# Patient Record
Sex: Female | Born: 1940 | Race: White | Hispanic: No | Smoking: Never smoker
Health system: Southern US, Community
[De-identification: ages and names within clinical notes are randomized; demographics above are authoritative.]

## PROBLEM LIST (undated history)

## (undated) DIAGNOSIS — E039 Hypothyroidism, unspecified: Secondary | ICD-10-CM

## (undated) DIAGNOSIS — H5440 Blindness, one eye, unspecified eye: Secondary | ICD-10-CM

## (undated) DIAGNOSIS — I10 Essential (primary) hypertension: Secondary | ICD-10-CM

## (undated) DIAGNOSIS — M1990 Unspecified osteoarthritis, unspecified site: Secondary | ICD-10-CM

## (undated) DIAGNOSIS — E785 Hyperlipidemia, unspecified: Secondary | ICD-10-CM

## (undated) DIAGNOSIS — I739 Peripheral vascular disease, unspecified: Secondary | ICD-10-CM

## (undated) DIAGNOSIS — I442 Atrioventricular block, complete: Secondary | ICD-10-CM

## (undated) DIAGNOSIS — E119 Type 2 diabetes mellitus without complications: Secondary | ICD-10-CM

## (undated) DIAGNOSIS — I2510 Atherosclerotic heart disease of native coronary artery without angina pectoris: Secondary | ICD-10-CM

## (undated) DIAGNOSIS — Z950 Presence of cardiac pacemaker: Secondary | ICD-10-CM

## (undated) HISTORY — DX: Atrioventricular block, complete: I44.2

## (undated) HISTORY — DX: Presence of cardiac pacemaker: Z95.0

## (undated) HISTORY — PX: EYE SURGERY: SHX253

## (undated) HISTORY — DX: Atherosclerotic heart disease of native coronary artery without angina pectoris: I25.10

---

## 2017-05-06 ENCOUNTER — Ambulatory Visit: Payer: Self-pay | Admitting: Podiatry

## 2019-09-24 ENCOUNTER — Ambulatory Visit: Payer: Medicare Other | Attending: Internal Medicine

## 2019-09-24 DIAGNOSIS — Z20822 Contact with and (suspected) exposure to covid-19: Secondary | ICD-10-CM

## 2019-09-28 ENCOUNTER — Telehealth: Payer: Self-pay | Admitting: *Deleted

## 2019-09-28 NOTE — Telephone Encounter (Signed)
Patient called for results ,still pending,

## 2019-09-30 LAB — NOVEL CORONAVIRUS, NAA

## 2021-12-31 ENCOUNTER — Emergency Department (HOSPITAL_COMMUNITY): Payer: Medicare HMO

## 2021-12-31 ENCOUNTER — Observation Stay (HOSPITAL_COMMUNITY)
Admission: EM | Admit: 2021-12-31 | Discharge: 2022-01-03 | Disposition: A | Discharge status: Home / Self Care | Payer: Medicare HMO | Attending: Internal Medicine | Admitting: Internal Medicine

## 2021-12-31 ENCOUNTER — Other Ambulatory Visit: Payer: Self-pay

## 2021-12-31 DIAGNOSIS — I2510 Atherosclerotic heart disease of native coronary artery without angina pectoris: Principal | ICD-10-CM | POA: Insufficient documentation

## 2021-12-31 DIAGNOSIS — E669 Obesity, unspecified: Secondary | ICD-10-CM | POA: Insufficient documentation

## 2021-12-31 DIAGNOSIS — Z79899 Other long term (current) drug therapy: Secondary | ICD-10-CM | POA: Insufficient documentation

## 2021-12-31 DIAGNOSIS — F0390 Unspecified dementia without behavioral disturbance: Secondary | ICD-10-CM | POA: Diagnosis not present

## 2021-12-31 DIAGNOSIS — I160 Hypertensive urgency: Secondary | ICD-10-CM | POA: Insufficient documentation

## 2021-12-31 DIAGNOSIS — Z79890 Hormone replacement therapy (postmenopausal): Secondary | ICD-10-CM | POA: Diagnosis not present

## 2021-12-31 DIAGNOSIS — E1151 Type 2 diabetes mellitus with diabetic peripheral angiopathy without gangrene: Secondary | ICD-10-CM | POA: Diagnosis not present

## 2021-12-31 DIAGNOSIS — E039 Hypothyroidism, unspecified: Secondary | ICD-10-CM | POA: Insufficient documentation

## 2021-12-31 DIAGNOSIS — Z7982 Long term (current) use of aspirin: Secondary | ICD-10-CM | POA: Diagnosis not present

## 2021-12-31 DIAGNOSIS — E1165 Type 2 diabetes mellitus with hyperglycemia: Secondary | ICD-10-CM | POA: Diagnosis not present

## 2021-12-31 DIAGNOSIS — D751 Secondary polycythemia: Secondary | ICD-10-CM | POA: Diagnosis not present

## 2021-12-31 DIAGNOSIS — R918 Other nonspecific abnormal finding of lung field: Secondary | ICD-10-CM | POA: Diagnosis not present

## 2021-12-31 DIAGNOSIS — D696 Thrombocytopenia, unspecified: Secondary | ICD-10-CM | POA: Diagnosis present

## 2021-12-31 DIAGNOSIS — I200 Unstable angina: Principal | ICD-10-CM

## 2021-12-31 DIAGNOSIS — Z6832 Body mass index (BMI) 32.0-32.9, adult: Secondary | ICD-10-CM | POA: Insufficient documentation

## 2021-12-31 DIAGNOSIS — R079 Chest pain, unspecified: Secondary | ICD-10-CM | POA: Diagnosis present

## 2021-12-31 DIAGNOSIS — I700 Atherosclerosis of aorta: Secondary | ICD-10-CM | POA: Insufficient documentation

## 2021-12-31 DIAGNOSIS — E1169 Type 2 diabetes mellitus with other specified complication: Secondary | ICD-10-CM | POA: Diagnosis not present

## 2021-12-31 DIAGNOSIS — Z7984 Long term (current) use of oral hypoglycemic drugs: Secondary | ICD-10-CM | POA: Diagnosis not present

## 2021-12-31 DIAGNOSIS — R2689 Other abnormalities of gait and mobility: Secondary | ICD-10-CM | POA: Insufficient documentation

## 2021-12-31 DIAGNOSIS — Z66 Do not resuscitate: Secondary | ICD-10-CM | POA: Insufficient documentation

## 2021-12-31 DIAGNOSIS — I739 Peripheral vascular disease, unspecified: Secondary | ICD-10-CM | POA: Diagnosis present

## 2021-12-31 DIAGNOSIS — E785 Hyperlipidemia, unspecified: Secondary | ICD-10-CM | POA: Insufficient documentation

## 2021-12-31 HISTORY — DX: Type 2 diabetes mellitus without complications: E11.9

## 2021-12-31 HISTORY — DX: Blindness, one eye, unspecified eye: H54.40

## 2021-12-31 HISTORY — DX: Unspecified osteoarthritis, unspecified site: M19.90

## 2021-12-31 HISTORY — DX: Hyperlipidemia, unspecified: E78.5

## 2021-12-31 HISTORY — DX: Hypothyroidism, unspecified: E03.9

## 2021-12-31 HISTORY — DX: Peripheral vascular disease, unspecified: I73.9

## 2021-12-31 HISTORY — DX: Essential (primary) hypertension: I10

## 2021-12-31 LAB — CBC
HCT: 47.7 % — ABNORMAL HIGH (ref 36.0–46.0)
Hemoglobin: 16.5 g/dL — ABNORMAL HIGH (ref 12.0–15.0)
MCH: 32.2 pg (ref 26.0–34.0)
MCHC: 34.6 g/dL (ref 30.0–36.0)
MCV: 93 fL (ref 80.0–100.0)
Platelets: 143 10*3/uL — ABNORMAL LOW (ref 150–400)
RBC: 5.13 MIL/uL — ABNORMAL HIGH (ref 3.87–5.11)
RDW: 13.5 % (ref 11.5–15.5)
WBC: 9.7 10*3/uL (ref 4.0–10.5)
nRBC: 0 % (ref 0.0–0.2)

## 2021-12-31 LAB — BASIC METABOLIC PANEL
Anion gap: 10 (ref 5–15)
BUN: 8 mg/dL (ref 8–23)
CO2: 27 mmol/L (ref 22–32)
Calcium: 10.1 mg/dL (ref 8.9–10.3)
Chloride: 101 mmol/L (ref 98–111)
Creatinine, Ser: 0.66 mg/dL (ref 0.44–1.00)
GFR, Estimated: >60 mL/min (ref >60)
Glucose, Bld: 125 mg/dL — ABNORMAL HIGH (ref 70–99)
Potassium: 4 mmol/L (ref 3.5–5.1)
Sodium: 138 mmol/L (ref 135–145)

## 2021-12-31 LAB — TROPONIN I (HIGH SENSITIVITY)
Troponin I (High Sensitivity): 10 ng/L (ref <18)
Troponin I (High Sensitivity): 16 ng/L (ref <18)

## 2021-12-31 LAB — CBG MONITORING, ED: Glucose-Capillary: 125 mg/dL — ABNORMAL HIGH (ref 70–99)

## 2021-12-31 MED ORDER — IOHEXOL 350 MG/ML SOLN
100.0000 mL | Freq: Once | INTRAVENOUS | Status: AC | PRN
Start: 2021-12-31 — End: 2021-12-31
  Administered 2021-12-31: 100 mL via INTRAVENOUS

## 2021-12-31 MED ORDER — METHOCARBAMOL 1000 MG/10ML IJ SOLN
500.0000 mg | Freq: Once | INTRAMUSCULAR | Status: AC
  Administered 2021-12-31: 500 mg via INTRAMUSCULAR
  Filled 2021-12-31: qty 5

## 2021-12-31 MED ORDER — INSULIN ASPART 100 UNIT/ML IJ SOLN: 0.0000 [IU] | Freq: Three times a day (TID) | INTRAMUSCULAR | Status: DC

## 2021-12-31 MED ORDER — DONEPEZIL HCL 5 MG PO TABS
5.0000 mg | ORAL_TABLET | Freq: Every day | ORAL | Status: DC
  Administered 2022-01-01 – 2022-01-02 (×3): 5 mg via ORAL
  Filled 2021-12-31 (×3): qty 1

## 2021-12-31 MED ORDER — ASPIRIN 81 MG PO CHEW
324.0000 mg | CHEWABLE_TABLET | Freq: Once | ORAL | Status: DC
  Filled 2021-12-31: qty 4

## 2021-12-31 MED ORDER — MORPHINE SULFATE (PF) 2 MG/ML IV SOLN
1.0000 mg | INTRAVENOUS | Status: DC | PRN
  Administered 2022-01-01: 1 mg via INTRAVENOUS
  Filled 2021-12-31: qty 1

## 2021-12-31 MED ORDER — ACETAMINOPHEN 325 MG PO TABS
650.0000 mg | ORAL_TABLET | Freq: Four times a day (QID) | ORAL | Status: DC | PRN
  Administered 2022-01-01 – 2022-01-03 (×4): 650 mg via ORAL
  Filled 2021-12-31 (×5): qty 2

## 2021-12-31 MED ORDER — AMLODIPINE BESYLATE 5 MG PO TABS
5.0000 mg | ORAL_TABLET | Freq: Every day | ORAL | Status: DC
  Administered 2022-01-01 – 2022-01-02 (×3): 5 mg via ORAL
  Filled 2021-12-31 (×3): qty 1

## 2021-12-31 MED ORDER — NITROGLYCERIN 2 % TD OINT
1.0000 [in_us] | TOPICAL_OINTMENT | Freq: Once | TRANSDERMAL | Status: AC
  Administered 2021-12-31: 1 [in_us] via TOPICAL
  Filled 2021-12-31: qty 1

## 2021-12-31 MED ORDER — ATENOLOL 25 MG PO TABS
75.0000 mg | ORAL_TABLET | Freq: Every day | ORAL | Status: DC
  Administered 2022-01-01 – 2022-01-02 (×3): 75 mg via ORAL
  Filled 2021-12-31 (×3): qty 3

## 2021-12-31 MED ORDER — ENOXAPARIN SODIUM 40 MG/0.4ML IJ SOSY
40.0000 mg | PREFILLED_SYRINGE | Freq: Every day | INTRAMUSCULAR | Status: DC
  Administered 2022-01-01 – 2022-01-03 (×3): 40 mg via SUBCUTANEOUS
  Filled 2021-12-31 (×3): qty 0.4

## 2021-12-31 MED ORDER — FENTANYL CITRATE PF 50 MCG/ML IJ SOSY: 50.0000 ug | PREFILLED_SYRINGE | INTRAMUSCULAR | Status: DC | PRN

## 2021-12-31 MED ORDER — INSULIN ASPART 100 UNIT/ML IJ SOLN: 0.0000 [IU] | Freq: Every day | INTRAMUSCULAR | Status: DC

## 2021-12-31 MED ORDER — ASPIRIN EC 81 MG PO TBEC
81.0000 mg | DELAYED_RELEASE_TABLET | Freq: Every day | ORAL | Status: DC
Start: 2022-01-01 — End: 2022-01-03
  Administered 2022-01-01 – 2022-01-03 (×3): 81 mg via ORAL
  Filled 2021-12-31 (×3): qty 1

## 2021-12-31 MED ORDER — FENTANYL CITRATE PF 50 MCG/ML IJ SOSY
50.0000 ug | PREFILLED_SYRINGE | Freq: Once | INTRAMUSCULAR | Status: AC
  Administered 2021-12-31: 50 ug via INTRAVENOUS
  Filled 2021-12-31: qty 1

## 2021-12-31 MED ORDER — HYDROCHLOROTHIAZIDE 25 MG PO TABS
25.0000 mg | ORAL_TABLET | Freq: Every day | ORAL | Status: DC
  Administered 2022-01-01 – 2022-01-02 (×3): 25 mg via ORAL
  Filled 2021-12-31 (×3): qty 1

## 2021-12-31 NOTE — ED Triage Notes (Signed)
Pt BIB EMS from home for increasing chest pain over the last week, today was the worst she reports a 5/10 pain, dull left side of chest not radiating, shob, headache, nausea, and weakness. EMS got a bp of 180/120 and after 4 of nitro her BP went down to 148/92 ?

## 2021-12-31 NOTE — ED Notes (Signed)
Pt.'s daughter at the bedside does report the pt was clammy when EMS arrived, pt reports relief of chest pain after nitro admin,  ?

## 2021-12-31 NOTE — ED Notes (Signed)
Patient transported to CT 

## 2021-12-31 NOTE — ED Provider Notes (Signed)
?[facility] ?Provider Note ? ? ?CSN: [id number] ?Arrival date & time: 12/31/21  1805 ? ?  ? ?History ? ?Chief Complaint  ?Patient presents with  ? Chest Pain  ? ? ?Jasmine Crawford is a 81 y.o. female. ? ?HPI ? ?HPI: A 81 year old patient with a history of treated diabetes and hypertension presents for evaluation of chest pain. Initial onset of pain was approximately 1-3 hours ago. The patient's chest pain is described as heaviness/pressure/tightness, is not worse with exertion and is relieved by nitroglycerin. The patient's chest pain is middle- or left-sided, is not well-localized, is not sharp and does radiate to the arms/jaw/neck. The patient does not complain of nausea and denies diaphoresis. The patient has no history of stroke, has no history of peripheral artery disease, has not smoked in the past 90 days, has no relevant family history of coronary artery disease (first degree relative at less than age 64), has no history of hypercholesterolemia and does not have an elevated BMI (>=30).  ? ?81 year old patient comes in with chief complaint of chest pain, headache, neck pain. ?She has history of hypertension and diabetes.  No history of coronary artery disease, stroke. ? ?According to the patient, she has been having posterior headache radiating down her neck and going down the right upper extremity over the last 3 days.  The symptoms are fairly constant.  She has had similar symptoms in the past, but the current episode is lasting longer and getting worse.  She describes the pain as dull, moderately severe pain that is worse with movement of her head.  She indicates that her vision appears slightly worse, but there is no other new dizziness, focal numbness, focal weakness. ? ?Additionally, patient is also complaining of chest pain over the left side.  This is new for her and also present over the last 3 days.  Pain is intermittent.  Pain is nonradiating and there is no  specific exertional, pleuritic or musculoskeletal component associated with it.  Patient indicates associated shortness of breath and nausea and per EMS patient was clammy. ? ?Patient resides in [place] and gets her care at [facility].  ? ?No history of heavy tobacco use disorder. ? ?Home Medications ?Prior to Admission medications   ?Medication Sig Start Date End Date Taking? Authorizing Provider  ?albuterol (VENTOLIN HFA) 108 (90 Base) MCG/ACT inhaler Inhale 1-2 puffs into the lungs every 6 (six) hours as needed for wheezing or shortness of breath.   Yes [provider]  ?amLODipine (NORVASC) 5 MG tablet Take 5 mg by mouth at bedtime.   Yes [provider]  ?ammonium lactate (LAC-HYDRIN) 12 % lotion Apply 1 application. topically as needed for dry skin.   Yes [provider]  ?aspirin EC 81 MG tablet Take 81 mg by mouth daily. Swallow whole.   Yes [provider]  ?atenolol (TENORMIN) 25 MG tablet Take 75 mg by mouth at bedtime.   Yes [provider]  ?donepezil (ARICEPT) 5 MG tablet Take 5 mg by mouth at bedtime.   Yes [provider]  ?fluticasone (FLONASE) 50 MCG/ACT nasal spray Place 1 spray into both nostrils 2 (two) times daily as needed for allergies or rhinitis.   Yes [provider]  ?hydrochlorothiazide (HYDRODIURIL) 25 MG tablet Take 25 mg by mouth at bedtime.   Yes [provider]  ?metFORMIN (GLUCOPHAGE-XR) 500 MG 24 hr tablet Take 500 mg by mouth in the morning and at bedtime.   Yes [provider]  ?naproxen (NAPROSYN) 500 MG tablet Take 500 mg by mouth 2 (two) times daily as needed for mild pain.   Yes [provider]  ?UNKNOWN TO PATIENT Take 1 tablet by mouth See admin instructions. Unknown to patient (tablet for anxiety)- Take 1 tablet by mouth in the morning   Yes [provider]  ?   ? ?Allergies    ?Bacitracin, Erythromycin base, Losartan, and Ramipril   ? ?Review of Systems   ?Review of Systems  ?All  other systems reviewed and are negative. ? ?Physical Exam ?Updated Vital Signs ?BP 124/75 (BP Location: Right Arm)   Pulse 62   Temp 98.1 ?F (36.7 ?C) (Oral)   Resp (!) 21   Ht 5\' 3"  (1.6 m)   Wt 84.3 kg   SpO2 99%   BMI 32.93 kg/m?  ?Physical Exam ?Vitals and nursing note reviewed.  ?Constitutional:   ?   Appearance: She is well-developed.  ?HENT:  ?   Head: Atraumatic.  ?Eyes:  ?   Extraocular Movements: Extraocular movements intact.  ?   Pupils: Pupils are equal, round, and reactive to light.  ?   Comments: No nystagmus  ?Cardiovascular:  ?   Rate and Rhythm: Normal rate.  ?   Heart sounds: Normal heart sounds. No murmur heard. ?Pulmonary:  ?   Effort: Pulmonary effort is normal.  ?   Breath sounds: Normal breath sounds.  ?Chest:  ?   Comments: Patient has 2+ and equal bilateral radial pulse and dorsalis pedis. ?Blood pressure checked in each upper extremity, SBP is 187 on the left upper extremity and 190 in the right upper extremity ?Musculoskeletal:  ?   Cervical back: Normal range of motion and neck supple.  ?   Right lower leg: No edema.  ?   Left lower leg: No edema.  ?Skin: ?   General: Skin is warm and dry.  ?Neurological:  ?   Mental Status: She is alert and oriented to person, place, and time.  ? ? ?ED Results / Procedures / Treatments   ?Labs ?(all labs ordered are listed, but only abnormal results are displayed) ?Labs Reviewed  ?CBC - Abnormal; Notable for the following components:  ?    Result Value  ? RBC 5.13 (*)   ? Hemoglobin 16.5 (*)   ? HCT 47.7 (*)   ? Platelets 143 (*)   ? All other components within normal limits  ?BASIC METABOLIC PANEL - Abnormal; Notable for the following components:  ? Glucose, Bld 125 (*)   ? All other components within normal limits  ?HEMOGLOBIN A1C - Abnormal; Notable for the following components:  ? Hgb A1c MFr Bld 7.3 (*)   ? All other components within normal limits  ?CBC - Abnormal; Notable for the following components:  ? WBC 10.6 (*)   ? Hemoglobin 15.8 (*)    ? All other components within normal limits  ?D-DIMER, QUANTITATIVE - Abnormal; Notable for the following components:  ? D-Dimer, Quant 1.40 (*)   ? All other components within normal limits  ?GLUCOSE, CAPILLARY - Abnormal; Notable for the following components:  ? Glucose-Capillary 288 (*)   ? All other components within normal limits  ?GLUCOSE, CAPILLARY - Abnormal; Notable for the following components:  ? Glucose-Capillary 174 (*)   ? All other components within normal limits  ?GLUCOSE, CAPILLARY - Abnormal; Notable for the following components:  ? Glucose-Capillary 126 (*)   ? All other components within normal limits  ?  GLUCOSE, CAPILLARY - Abnormal; Notable for the following components:  ? Glucose-Capillary 149 (*)   ? All other components within normal limits  ?GLUCOSE, CAPILLARY - Abnormal; Notable for the following components:  ? Glucose-Capillary 266 (*)   ? All other components within normal limits  ?GLUCOSE, CAPILLARY - Abnormal; Notable for the following components:  ? Glucose-Capillary 170 (*)   ? All other components within normal limits  ?GLUCOSE, CAPILLARY - Abnormal; Notable for the following components:  ? Glucose-Capillary 136 (*)   ? All other components within normal limits  ?GLUCOSE, CAPILLARY - Abnormal; Notable for the following components:  ? Glucose-Capillary 177 (*)   ? All other components within normal limits  ?GLUCOSE, CAPILLARY - Abnormal; Notable for the following components:  ? Glucose-Capillary 221 (*)   ? All other components within normal limits  ?GLUCOSE, CAPILLARY - Abnormal; Notable for the following components:  ? Glucose-Capillary 186 (*)   ? All other components within normal limits  ?CBG MONITORING, ED - Abnormal; Notable for the following components:  ? Glucose-Capillary 125 (*)   ? All other components within normal limits  ?CBG MONITORING, ED - Abnormal; Notable for the following components:  ? Glucose-Capillary 238 (*)   ? All other components within normal limits   ?LIPID PANEL  ?TSH  ?TROPONIN I (HIGH SENSITIVITY)  ?TROPONIN I (HIGH SENSITIVITY)  ?TROPONIN I (HIGH SENSITIVITY)  ?TROPONIN I (HIGH SENSITIVITY)  ? ? ?EKG ?EKG Interpretation ? ?Date/Time:  Sunday December 31 2021 1

## 2021-12-31 NOTE — ED Provider Notes (Signed)
Care assumed from Villa Calma MD at shift change, please see their note for full detail, but in brief Jasmine Crawford is a 81 y.o. female presents with  posterior headache radiating down to RUE and chest pain. ? ?Plan: Admission for chest pain workup, pending CT angio ? ?Physical Exam  ?BP (!) 176/84   Pulse 65   Temp 98 ?F (36.7 ?C) (Oral)   Resp 19   Ht 5\' 4"  (1.626 m)   Wt 88.5 kg   SpO2 100%   BMI 33.47 kg/m?  ? ?Physical Exam ? ?Procedures  ?Procedures ? ?ED Course / MDM  ? ?Clinical Course as of 12/31/21 2049  ?2050 Dec 31, 2021  ?2048 Patient's metabolic profile, troponins are still pending.  CT angiogram is also pending. ? ?She received IV fentanyl, feels better.  Chest pain has been off and on since my assessment. ? ?I will put nitro paste on her chest.  As needed fentanyl ordered. ? ?Patient's care will be signed out to my colleague, plan is for patient to be admitted after the CT angiogram and basic blood work-up has resulted. ? ?Patient's family made aware of this plan [AN]  ?  ?Clinical Course User Index ?[AN] 2049, MD  ? ?Medical Decision Making ?Amount and/or Complexity of Data Reviewed ?Labs: ordered. ?Radiology: ordered. ? ?Risk ?Prescription drug management. ? ? ?Patient CT angio of head and neck without acute findings or vertebral dissection.  And.  I agree with radiologist interpretation reviewed imaging myself.  I also viewed and interpreted all labs with delta troponin normal.  Patient has heart score of 6. ? ? ?I consulted with cardiology on-call who reviewed the patient's labs, EKG and imaging and will be willing to consult on her case tomorrow morning after she has been admitted. ? ?Spoke with Dr. Derwood Kaplan who agrees to admit patient. ? ? ? ? ? ?  ?Loney Loh, Janell Quiet ?12/31/21 2258 ? ?  ?2259, MD ?01/03/22 0022 ? ?

## 2021-12-31 NOTE — H&P (Signed)
?History and Physical  ? ? ?Jasmine Crawford ZGY:[id number] DOB: April 14, 1941 DOA: 12/31/2021 ? ?PCP: Marguerita Beards, MD ? ?Patient coming from: Home ? ?Chief Complaint: Chest pain ? ?HPI: Jasmine Crawford is a 81 y.o. female with medical history significant of dementia, hypertension, hyperlipidemia, non-insulin-dependent type 2 diabetes, peripheral arterial disease, hypothyroidism, glaucoma presenting to the ED for evaluation of chest pain, posterior headache, and neck pain.  Blood pressure 180/120 with EMS, went down to 148/92 after she was given nitroglycerin x4.  Blood pressure 182/92 on arrival to the ED, remainder of vital signs stable.  Labs showing hemoglobin 16.5.  Platelet count 143k.  Initial high-sensitivity troponin negative, repeat pending.  EKG showing left bundle branch block, no prior tracing for comparison.  Chest x-ray showing no active disease. CTA head and neck without evidence of dissection.  ED physician discussed the case with Dr. Rosita Fire, cardiology will consult in the morning.  Medications administered in the ED include aspirin 324 mg, fentanyl, Robaxin, and nitroglycerin ointment. ? ?Patient reports 1 week history of intermittent episodes of nonexertional, left-sided, 5 out of 10 intensity, pressure-like chest pain associated with dyspnea, nausea, and diaphoresis.  States typically her blood pressure is high during these episodes.  States today before EMS arrived her chest pain was severe and she was clammy.  She attributes her chest pain to possible lump in her left breast for which she is scheduled for mammogram later this month.  Denies history of heart disease.  Daughter states patient had chest pain about 12 years ago and was seen at [facility] at that time and was told that her work-up was normal.  Patient denies history of blood clots.  She also reports intermittent right-sided posterior headache/neck pain for several days and when she checked her blood pressure yesterday during one of these episodes  it was high in the 170s.  States her chest pain and headache/neck pain usually improve after she takes her blood pressure medications.  Reports compliance with her home medications and has not missed any doses. ? ?Review of Systems:  ?Review of Systems  ?All other systems reviewed and are negative. ? ?Past medical history: See HPI. ? ?Surgical history: Oculoplastic surgery, left breast biopsy ? ?Social history: No tobacco, ethanol, or illicit drug use. ? ?Allergies  ?Allergen Reactions  ? Bacitracin Swelling and Other (See Comments)  ?  No swelling of the throat  ? Erythromycin Base Swelling and Other (See Comments)  ?  No swelling of the throat  ? Losartan Other (See Comments)  ?  Reaction not recalled ?  ? Ramipril Other (See Comments)  ?  Reaction not recalled  ? ? ?Family history: Brother 1 (cancer, diabetes, heart disease, hypertension, brother 2 (diabetes, hypertension), brother 3 (diabetes, hypertension), father (heart disease), paternal aunt (breast cancer) ? ?Prior to Admission medications   ?Medication Sig Start Date End Date Taking? Authorizing Provider  ?albuterol (VENTOLIN HFA) 108 (90 Base) MCG/ACT inhaler Inhale 1-2 puffs into the lungs every 6 (six) hours as needed for wheezing or shortness of breath.   Yes [provider]  ?amLODipine (NORVASC) 5 MG tablet Take 5 mg by mouth at bedtime.   Yes [provider]  ?ammonium lactate (LAC-HYDRIN) 12 % lotion Apply 1 application. topically as needed for dry skin.   Yes [provider]  ?aspirin EC 81 MG tablet Take 81 mg by mouth daily. Swallow whole.   Yes [provider]  ?atenolol (TENORMIN) 25 MG tablet Take 75 mg by mouth  at bedtime.   Yes [provider]  ?donepezil (ARICEPT) 5 MG tablet Take 5 mg by mouth at bedtime.   Yes [provider]  ?fluticasone (FLONASE) 50 MCG/ACT nasal spray Place 1 spray into both nostrils 2 (two) times daily as needed for allergies or rhinitis.   Yes [provider]  ?hydrochlorothiazide (HYDRODIURIL) 25 MG tablet Take 25 mg by mouth at bedtime.   Yes [provider]  ?metFORMIN (GLUCOPHAGE-XR) 500 MG 24 hr tablet Take 500 mg by mouth in the morning and at bedtime.   Yes [provider]  ?naproxen (NAPROSYN) 500 MG tablet Take 500 mg by mouth 2 (two) times daily as needed for mild pain.   Yes [provider]  ?UNKNOWN TO PATIENT Take 1 tablet by mouth See admin instructions. Unknown to patient (tablet for anxiety)- Take 1 tablet by mouth in the morning   Yes [provider]  ? ? ?Physical Exam: ?Vitals:  ? 12/31/21 2245 12/31/21 2300 12/31/21 2330 12/31/21 2345  ?BP: 103/71 136/80 134/74 (!) 158/85  ?Pulse: 61 61 62 71  ?Resp: 12 16 18 19   ?Temp:      ?TempSrc:      ?SpO2: 98% 97% 99% 99%  ?Weight:      ?Height:      ? ? ?Physical Exam ?Constitutional:   ?   General: She is not in acute distress. ?HENT:  ?   Head: Normocephalic and atraumatic.  ?Eyes:  ?   Extraocular Movements: Extraocular movements intact.  ?   Conjunctiva/sclera: Conjunctivae normal.  ?Cardiovascular:  ?   Rate and Rhythm: Normal rate and regular rhythm.  ?   Pulses: Normal pulses.  ?   Comments: Chest pain reproducible on exam on palpation of the left lower chest ?Pulmonary:  ?   Effort: Pulmonary effort is normal. No respiratory distress.  ?   Breath sounds: Normal breath sounds. No wheezing or rales.  ?Abdominal:  ?   General: Bowel sounds are normal. There is no distension.  ?   Palpations: Abdomen is soft.  ?   Tenderness: There is no abdominal tenderness.  ?Musculoskeletal:  ?   Cervical back: Normal range of motion. No rigidity.  ?   Comments: Mild edema at the level of ankles bilaterally  ?Skin: ?   General: Skin is warm and dry.  ?Neurological:  ?   General: No focal deficit present.  ?   Mental Status: She is alert and oriented to person, place, and time.  ?   Cranial Nerves: No cranial nerve deficit.  ?   Sensory: No sensory deficit.  ?   Motor:  No weakness.  ?  ? ?Labs on Admission: I have personally reviewed following labs and imaging studies ? ?CBC: ?Recent Labs  ?Lab 12/31/21 ?1851  ?WBC 9.7  ?HGB 16.5*  ?HCT 47.7*  ?MCV 93.0  ?PLT 143*  ? ?Basic Metabolic Panel: ?Recent Labs  ?Lab 12/31/21 ?2033  ?NA 138  ?K 4.0  ?CL 101  ?CO2 27  ?GLUCOSE 125*  ?BUN 8  ?CREATININE 0.66  ?CALCIUM 10.1  ? ?GFR: ?Estimated Creatinine Clearance: 58.4 mL/min (by C-G formula based on SCr of 0.66 mg/dL). ?Liver Function Tests: ?No results for input(s): AST, ALT, ALKPHOS, BILITOT, PROT, ALBUMIN in the last 168 hours. ?No results for input(s): LIPASE, AMYLASE in the last 168 hours. ?No results for input(s): AMMONIA in the last 168 hours. ?Coagulation Profile: ?No results for input(s): INR, PROTIME in the last 168 hours. ?  Cardiac Enzymes: ?No results for input(s): CKTOTAL, CKMB, CKMBINDEX, TROPONINI in the last 168 hours. ?BNP (last 3 results) ?No results for input(s): PROBNP in the last 8760 hours. ?HbA1C: ?No results for input(s): HGBA1C in the last 72 hours. ?CBG: ?Recent Labs  ?Lab 12/31/21 ?2024  ?GLUCAP 125*  ? ?Lipid Profile: ?No results for input(s): CHOL, HDL, LDLCALC, TRIG, CHOLHDL, LDLDIRECT in the last 72 hours. ?Thyroid Function Tests: ?No results for input(s): TSH, T4TOTAL, FREET4, T3FREE, THYROIDAB in the last 72 hours. ?Anemia Panel: ?No results for input(s): VITAMINB12, FOLATE, FERRITIN, TIBC, IRON, RETICCTPCT in the last 72 hours. ?Urine analysis: ?No results found for: COLORURINE, APPEARANCEUR, LABSPEC, PHURINE, GLUCOSEU, HGBUR, BILIRUBINUR, KETONESUR, PROTEINUR, UROBILINOGEN, NITRITE, LEUKOCYTESUR ? ?Radiological Exams on Admission: I have personally reviewed images ?CT ANGIO HEAD NECK W WO CM ? ?Result Date: 12/31/2021 ?CLINICAL DATA:  Posterior headache and neck pain EXAM: CT ANGIOGRAPHY HEAD AND NECK TECHNIQUE: Multidetector CT imaging of the head and neck was performed using the standard protocol during bolus administration of intravenous contrast.  Multiplanar CT image reconstructions and MIPs were obtained to evaluate the vascular anatomy. Carotid stenosis measurements (when applicable) are obtained utilizing NASCET criteria, using the distal internal carotid

## 2022-01-01 ENCOUNTER — Inpatient Hospital Stay (HOSPITAL_COMMUNITY): Payer: Medicare HMO

## 2022-01-01 ENCOUNTER — Encounter (HOSPITAL_COMMUNITY): Payer: Self-pay | Admitting: Internal Medicine

## 2022-01-01 ENCOUNTER — Observation Stay (HOSPITAL_COMMUNITY): Payer: Medicare HMO

## 2022-01-01 ENCOUNTER — Observation Stay (HOSPITAL_BASED_OUTPATIENT_CLINIC_OR_DEPARTMENT_OTHER): Payer: Medicare HMO

## 2022-01-01 DIAGNOSIS — E1169 Type 2 diabetes mellitus with other specified complication: Secondary | ICD-10-CM | POA: Diagnosis present

## 2022-01-01 DIAGNOSIS — F0390 Unspecified dementia without behavioral disturbance: Secondary | ICD-10-CM | POA: Diagnosis not present

## 2022-01-01 DIAGNOSIS — E039 Hypothyroidism, unspecified: Secondary | ICD-10-CM | POA: Diagnosis present

## 2022-01-01 DIAGNOSIS — I208 Other forms of angina pectoris: Secondary | ICD-10-CM | POA: Diagnosis not present

## 2022-01-01 DIAGNOSIS — R931 Abnormal findings on diagnostic imaging of heart and coronary circulation: Secondary | ICD-10-CM

## 2022-01-01 DIAGNOSIS — R9431 Abnormal electrocardiogram [ECG] [EKG]: Secondary | ICD-10-CM | POA: Diagnosis not present

## 2022-01-01 DIAGNOSIS — D696 Thrombocytopenia, unspecified: Secondary | ICD-10-CM

## 2022-01-01 DIAGNOSIS — E669 Obesity, unspecified: Secondary | ICD-10-CM

## 2022-01-01 DIAGNOSIS — E66811 Obesity, class 1: Secondary | ICD-10-CM | POA: Diagnosis present

## 2022-01-01 DIAGNOSIS — I160 Hypertensive urgency: Secondary | ICD-10-CM

## 2022-01-01 DIAGNOSIS — R079 Chest pain, unspecified: Secondary | ICD-10-CM

## 2022-01-01 DIAGNOSIS — I2510 Atherosclerotic heart disease of native coronary artery without angina pectoris: Secondary | ICD-10-CM | POA: Diagnosis not present

## 2022-01-01 DIAGNOSIS — I739 Peripheral vascular disease, unspecified: Secondary | ICD-10-CM | POA: Diagnosis not present

## 2022-01-01 LAB — ECHOCARDIOGRAM COMPLETE
AR max vel: 2.62 cm2
AV Area VTI: 2.58 cm2
AV Area mean vel: 2.42 cm2
AV Mean grad: 6 mmHg
AV Peak grad: 11.8 mmHg
Ao pk vel: 1.72 m/s
Area-P 1/2: 2.95 cm2
Calc EF: 61.7 %
Height: 63 in
S' Lateral: 2.6 cm
Single Plane A2C EF: 60.2 %
Single Plane A4C EF: 61.7 %
Weight: 2974.4 oz

## 2022-01-01 LAB — GLUCOSE, CAPILLARY
Glucose-Capillary: 288 mg/dL — ABNORMAL HIGH (ref 70–99)
Glucose-Capillary: 174 mg/dL — ABNORMAL HIGH (ref 70–99)
Glucose-Capillary: 126 mg/dL — ABNORMAL HIGH (ref 70–99)
Glucose-Capillary: 149 mg/dL — ABNORMAL HIGH (ref 70–99)
Glucose-Capillary: 266 mg/dL — ABNORMAL HIGH (ref 70–99)

## 2022-01-01 LAB — CBC
HCT: 45.8 % (ref 36.0–46.0)
Hemoglobin: 15.8 g/dL — ABNORMAL HIGH (ref 12.0–15.0)
MCH: 31.6 pg (ref 26.0–34.0)
MCHC: 34.5 g/dL (ref 30.0–36.0)
MCV: 91.6 fL (ref 80.0–100.0)
Platelets: 209 10*3/uL (ref 150–400)
RBC: 5 MIL/uL (ref 3.87–5.11)
RDW: 13.5 % (ref 11.5–15.5)
WBC: 10.6 10*3/uL — ABNORMAL HIGH (ref 4.0–10.5)
nRBC: 0 % (ref 0.0–0.2)

## 2022-01-01 LAB — CBG MONITORING, ED: Glucose-Capillary: 238 mg/dL — ABNORMAL HIGH (ref 70–99)

## 2022-01-01 LAB — LIPID PANEL
Cholesterol: 162 mg/dL (ref 0–200)
HDL: 53 mg/dL (ref >40)
LDL Cholesterol: 85 mg/dL (ref 0–99)
Total CHOL/HDL Ratio: 3.1 RATIO
Triglycerides: 122 mg/dL (ref <150)
VLDL: 24 mg/dL (ref 0–40)

## 2022-01-01 LAB — HEMOGLOBIN A1C
Hgb A1c MFr Bld: 7.3 % — ABNORMAL HIGH (ref 4.8–5.6)
Mean Plasma Glucose: 162.81 mg/dL

## 2022-01-01 LAB — TSH: TSH: 1.322 u[IU]/mL (ref 0.350–4.500)

## 2022-01-01 LAB — TROPONIN I (HIGH SENSITIVITY)
Troponin I (High Sensitivity): 11 ng/L (ref <18)
Troponin I (High Sensitivity): 11 ng/L (ref <18)

## 2022-01-01 LAB — D-DIMER, QUANTITATIVE: D-Dimer, Quant: 1.4 ug/mL-FEU — ABNORMAL HIGH (ref 0.00–0.50)

## 2022-01-01 MED ORDER — INSULIN ASPART 100 UNIT/ML IJ SOLN
0.0000 [IU] | INTRAMUSCULAR | Status: DC
  Administered 2022-01-01: 5 [IU] via SUBCUTANEOUS
  Administered 2022-01-01: 2 [IU] via SUBCUTANEOUS
  Administered 2022-01-01: 5 [IU] via SUBCUTANEOUS
  Administered 2022-01-01 (×2): 1 [IU] via SUBCUTANEOUS
  Administered 2022-01-02: 3 [IU] via SUBCUTANEOUS
  Administered 2022-01-02: 2 [IU] via SUBCUTANEOUS
  Administered 2022-01-02: 3 [IU] via SUBCUTANEOUS
  Administered 2022-01-02: 1 [IU] via SUBCUTANEOUS
  Administered 2022-01-02 (×2): 2 [IU] via SUBCUTANEOUS
  Administered 2022-01-03: 1 [IU] via SUBCUTANEOUS
  Administered 2022-01-03: 5 [IU] via SUBCUTANEOUS
  Administered 2022-01-03 (×2): 3 [IU] via SUBCUTANEOUS

## 2022-01-01 MED ORDER — NITROGLYCERIN 0.4 MG SL SUBL
SUBLINGUAL_TABLET | SUBLINGUAL | Status: AC
  Filled 2022-01-01: qty 2

## 2022-01-01 MED ORDER — ROSUVASTATIN CALCIUM 20 MG PO TABS
20.0000 mg | ORAL_TABLET | Freq: Every day | ORAL | Status: DC
  Administered 2022-01-01 – 2022-01-03 (×3): 20 mg via ORAL
  Filled 2022-01-01 (×3): qty 1

## 2022-01-01 MED ORDER — OXYCODONE HCL 5 MG PO TABS
5.0000 mg | ORAL_TABLET | ORAL | Status: DC | PRN
  Administered 2022-01-01 – 2022-01-02 (×2): 5 mg via ORAL
  Filled 2022-01-01 (×2): qty 1

## 2022-01-01 MED ORDER — IOHEXOL 350 MG/ML SOLN
100.0000 mL | Freq: Once | INTRAVENOUS | Status: AC | PRN
  Administered 2022-01-01: 100 mL via INTRAVENOUS

## 2022-01-01 MED ORDER — CYCLOBENZAPRINE HCL 10 MG PO TABS
5.0000 mg | ORAL_TABLET | Freq: Three times a day (TID) | ORAL | Status: DC
  Administered 2022-01-01 – 2022-01-03 (×5): 5 mg via ORAL
  Filled 2022-01-01: qty 0.5
  Filled 2022-01-01 (×4): qty 1

## 2022-01-01 NOTE — Assessment & Plan Note (Addendum)
Resolved with plt at 209. ?

## 2022-01-01 NOTE — Assessment & Plan Note (Deleted)
Patient endorsing right posterior headache/neck pain at home, symptoms have now subsided.  Neuro exam nonfocal and no meningeal signs.  CTA head and neck without evidence of dissection. ?-Continue to monitor ?

## 2022-01-01 NOTE — Assessment & Plan Note (Addendum)
Uncontrolled T2DM due to high Hgb A1c ? ?Patient was placed on insulin sliding scale for glucose cover and monitoring.  ?Her discharge fasting glucose 215. ?At discharge she will resume her usual diabetic regimen.  ?Follow up as outpatient.  ? ?Continue with statin therapy.  ?

## 2022-01-01 NOTE — Assessment & Plan Note (Addendum)
Continue with levothyroxine  

## 2022-01-01 NOTE — Consult Note (Addendum)
?Cardiology Consultation:  ? ?Patient ID: Jasmine Crawford ?MRN: [id number]; DOB: December 01, 1940 ? ?Admit date: 12/31/2021 ?Date of Consult: 01-13-22 ? ?PCP:  Durward Parcel, MD ?  ?[facility] Providers ?Cardiologist: New  ? ? ?Patient Profile:  ? ?Jasmine Crawford is a 81 y.o. female with a hx of hypertension, hyperlipidemia, diabetes mellitus, peripheral arterial disease and dementia who is being seen Jan 13, 2022 for the evaluation of chest pain at the request of Dr. Abagail Kitchens. ? ?History of Present Illness:  ? ?Jasmine Crawford presented for evaluation of chest pain.  Patient reported 4 days history of intermittent sharp substernal chest pain.  Some dyspnea. Nothing makes better or worse.  Each episode may be lasting for up to 4 hours.  May have associated palpitation.  Also reported elevated blood pressure up to 190s.  Patient has baseline dementia.  Also reported low neck headache. ? ?Upon arrival to emergency room blood pressure was elevated to 180/120.  She was given sublingual nitroglycerin x4.  Chest pain eventually resolved.  No reoccurrence. ? ?High-sensitivity troponin negative x4 ?Hemoglobin 15.8 ?Electrolyte and creatinine are normal ?Hemoglobin A1c 7.3 ?TSH 1.3 ?D-dimer elevated at 1.4 ? ?Patient reports similar episode few months ago but very less intensity.  Denies recent surgery or travel.  Denies syncope, orthopnea, PND or lower extremity edema. ? ?Younger brother had MI at age 1. ?Elder brother had history of CAD and multiple myeloma.  Died last October 14, 2022. ? ?Echo 01/13/22 ?1. Left ventricular ejection fraction, by estimation, is 60 to 65%. The  ?left ventricle has normal function. The left ventricle has no regional  ?wall motion abnormalities. There is mild concentric left ventricular  ?hypertrophy. Left ventricular diastolic  ?parameters are consistent with Grade I diastolic dysfunction (impaired  ?relaxation).  ? 2. Right ventricular systolic function is normal. The right ventricular  ?size is mildly enlarged. There  is normal pulmonary artery systolic  ?pressure. The estimated right ventricular systolic pressure is 50.5 mmHg.  ? 3. Left atrial size was mildly dilated.  ? 4. The mitral valve is normal in structure. Trivial mitral valve  ?regurgitation.  ? 5. The aortic valve is tricuspid. There is mild calcification of the  ?aortic valve. There is moderate thickening of the aortic valve. Aortic  ?valve regurgitation is not visualized. Aortic valve  ?sclerosis/calcification is present, without any evidence of  ?aortic stenosis.  ? 6. Aortic dilatation noted. There is borderline dilatation of the  ?ascending aorta, measuring 38 mm.  ? 7. The inferior vena cava is normal in size with greater than 50%  ?respiratory variability, suggesting right atrial pressure of 3 mmHg.  ? ?Comparison(s): No prior Echocardiogram.  ?Past Medical History:  ?Diagnosis Date  ? Diabetes ([place])   ? Hyperlipidemia   ? Hypertension   ? PAD (peripheral artery disease) ([place])   ? ? ?Inpatient Medications: ?Scheduled Meds: ? amLODipine  5 mg Oral QHS  ? aspirin EC  81 mg Oral Daily  ? atenolol  75 mg Oral QHS  ? donepezil  5 mg Oral QHS  ? enoxaparin (LOVENOX) injection  40 mg Subcutaneous Daily  ? hydrochlorothiazide  25 mg Oral QHS  ? insulin aspart  0-9 Units Subcutaneous Q4H  ? ?Continuous Infusions: ? ?PRN Meds: ?acetaminophen, morphine injection, oxyCODONE ? ?Allergies:    ?Allergies  ?Allergen Reactions  ? Bacitracin Swelling and Other (See Comments)  ?  No swelling of the throat  ? Erythromycin Base Swelling and Other (See Comments)  ?  No swelling of the throat  ?  Losartan Other (See Comments)  ?  Reaction not recalled ?  ? Ramipril Other (See Comments)  ?  Reaction not recalled  ? ? ?Social History:   ?Social History  ? ?Socioeconomic History  ? Marital status: Widowed  ?  Spouse name: Not on file  ? Number of children: Not on file  ? Years of education: Not on file  ? Highest education level: Not on file  ?Occupational History  ? Not on file   ?Tobacco Use  ? Smoking status: Not on file  ? Smokeless tobacco: Not on file  ?Substance and Sexual Activity  ? Alcohol use: Not on file  ? Drug use: Not on file  ? Sexual activity: Not on file  ?Other Topics Concern  ? Not on file  ?Social History Narrative  ? Not on file  ? ?Social Determinants of Health  ? ?Financial Resource Strain: Not on file  ?Food Insecurity: Not on file  ?Transportation Needs: Not on file  ?Physical Activity: Not on file  ?Stress: Not on file  ?Social Connections: Not on file  ?Intimate Partner Violence: Not on file  ?  ?Family History:   ?As above ? ?ROS:  ?Please see the history of present illness.  ?All other ROS reviewed and negative.    ? ?Physical Exam/Data:  ? ?Vitals:  ? 01/01/22 0135 01/01/22 0509 01/01/22 0743 01/01/22 1202  ?BP: (!) 145/87 140/78 110/67 135/65  ?Pulse: 64 (!) 57 62 (!) 59  ?Resp: $Remov'17 16 20 18  'hnjCVu$ ?Temp: (!) 97.5 ?F (36.4 ?C) 97.6 ?F (36.4 ?C) 98 ?F (36.7 ?C) 97.8 ?F (36.6 ?C)  ?TempSrc: Oral Oral Oral Oral  ?SpO2: 98%  99% 100%  ?Weight: 84.3 kg     ?Height: $RemoveB'5\' 3"'jJsTzqsO$  (1.6 m)     ? ? ?Intake/Output Summary (Last 24 hours) at 01/01/2022 1256 ?Last data filed at 01/01/2022 0145 ?Gross per 24 hour  ?Intake 120 ml  ?Output --  ?Net 120 ml  ? ? ?  01/01/2022  ?  1:35 AM 12/31/2021  ?  6:13 PM  ?Last 3 Weights  ?Weight (lbs) 185 lb 14.4 oz 195 lb  ?Weight (kg) 84.324 kg 88.451 kg  ?   ?Body mass index is 32.93 kg/m?.  ?General:  Well nourished, well developed, in no acute distress ?HEENT: normal ?Neck: no JVD ?Vascular: No carotid bruits; Distal pulses 2+ bilaterally ?Cardiac:  normal S1, S2; RRR; no murmur  ?Lungs:  clear to auscultation bilaterally, no wheezing, rhonchi or rales  ?Abd: soft, nontender, no hepatomegaly  ?Ext: no edema ?Musculoskeletal:  No deformities, BUE and BLE strength normal and equal ?Skin: warm and dry  ?Neuro:  CNs 2-12 intact, no focal abnormalities noted ?Psych:  Normal affect  ? ?EKG:  The EKG was personally reviewed and demonstrates: Sinus rhythm,  PVC, left bundle branch block ?Telemetry:  Telemetry was personally reviewed and demonstrates: Sinus rhythm, PVC ? ?Relevant CV Studies: ?PVL ARTERIAL DUPLEX LOWER EXTREMITY BILATERAL LIMITED 12/2020 ?Summary of Findings ? ?Right ?Inflow:   No evidence of hemodynamically significant inflow obstruction at rest. ?Outflow:  No evidence of hemodynamically significant outflow obstruction at ?         rest. ?Runoff:   Evidence of hemodynamically significant runoff obstruction at rest. No ?         flow detected within the PTA at the ankle. ?Left ?Inflow:   No evidence of hemodynamically significant inflow obstruction at rest. ?Outflow:  No evidence of hemodynamically significant outflow obstruction at ?  rest. ?Runoff:   Evidence of hemodynamically significant runoff obstruction at rest. No ?         flow detected within the PTA at the ankle. ? ?Final Interpretation ? ?Right: Evaluation. Arterial obstruction involving the tibioperoneal ?      vessels. RT great toe pressure = 110 mmHg. ? ?Laboratory Data: ? ?High Sensitivity Troponin:   ?Recent Labs  ?Lab 12/31/21 ?2033 12/31/21 ?2230 01/01/22 ?0427 01/01/22 ?8381  ?TROPONINIHS $RemoveBefor'10 16 11 11  'WZgVJcVxbQDc$ ?   ?Chemistry ?Recent Labs  ?Lab 12/31/21 ?2033  ?NA 138  ?K 4.0  ?CL 101  ?CO2 27  ?GLUCOSE 125*  ?BUN 8  ?CREATININE 0.66  ?CALCIUM 10.1  ?GFRNONAA >60  ?ANIONGAP 10  ?  ?No results for input(s): PROT, ALBUMIN, AST, ALT, ALKPHOS, BILITOT in the last 168 hours. ?Lipids  ?Recent Labs  ?Lab 01/01/22 ?0113  ?CHOL 162  ?TRIG 122  ?HDL 53  ?[place] 85  ?CHOLHDL 3.1  ?  ?Hematology ?Recent Labs  ?Lab 12/31/21 ?1851 01/01/22 ?0113  ?WBC 9.7 10.6*  ?RBC 5.13* 5.00  ?HGB 16.5* 15.8*  ?HCT 47.7* 45.8  ?MCV 93.0 91.6  ?MCH 32.2 31.6  ?MCHC 34.6 34.5  ?RDW 13.5 13.5  ?PLT 143* 209  ? ?Thyroid  ?Recent Labs  ?Lab 01/01/22 ?8403  ?TSH 1.322  ?  ?BNPNo results for input(s): BNP, PROBNP in the last 168 hours.  ?DDimer  ?Recent Labs  ?Lab 01/01/22 ?0113  ?DDIMER 1.40*  ? ? ? ?Radiology/Studies:   ?CT ANGIO HEAD NECK W WO CM ? ?Result Date: 12/31/2021 ?CLINICAL DATA:  Posterior headache and neck pain EXAM: CT ANGIOGRAPHY HEAD AND NECK TECHNIQUE: Multidetector CT imaging of the head and neck was performed

## 2022-01-01 NOTE — Assessment & Plan Note (Addendum)
Patient was placed on amlodipine, atenolol and hydrochlorothiazide.   ?Her discharge blood pressure 130/72 mmHg.  ? ?

## 2022-01-01 NOTE — Assessment & Plan Note (Signed)
Not on a statin. ?-Lipid panel ?

## 2022-01-01 NOTE — Assessment & Plan Note (Signed)
Mild. ?-Repeat CBC in a.m. to confirm ?

## 2022-01-01 NOTE — Assessment & Plan Note (Addendum)
Patient was admitted to the cardiac ward and was placed on a telemetry monitor. ?Acute coronary syndrome was ruled out.  ? ?High sensitive troponin are negative  ?EKG with no signs of acute ischemia but with precordial anterior Q waves. ?Positive d dimer.  ? ?Echocardiogram with preserved LV systolic function with EF 60 to 65%, mild LVH, right ventricle with preserved systolic function, no significant valvular disease.  ? ?Cardiac CT with calcium score of 248, in the 63rd percentile for age, sex and race match controls.  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 ?[place] lower extremities with no DVT ?Lung V/Q scan with low probability for pulmonary embolism.  ? 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  ?Patient was placed on muscle relaxant and oral analgesics with improvement in her symptoms.  ? ?

## 2022-01-01 NOTE — Assessment & Plan Note (Addendum)
On aspirin and statin therapy.  ?

## 2022-01-01 NOTE — Progress Notes (Signed)
?  Echocardiogram ?2D Echocardiogram has been performed. ? ?Jasmine Crawford ?01/01/2022, 9:26 AM ?

## 2022-01-01 NOTE — Assessment & Plan Note (Addendum)
On donezepil ?

## 2022-01-01 NOTE — Assessment & Plan Note (Signed)
Calculated BMI 32,9  ?

## 2022-01-01 NOTE — Hospital Course (Addendum)
Jasmine Crawford was admitted to the hospital with the working diagnosis of chest pain.  ? ?81 yo female with the past medical history of dementia, hypertension, T2DM, and hypothyroidism who presented with chest pain. Patient reported intermittent chest pain, precordial, non exertion related, associated with dyspnea, nausea and diaphoresis. On the day of her hospitalization she had a severe chest pain that prompted her to come to the hospital. On her initial physical examination her blood pressure was 182/92, HR 61, RR 16, 02 saturation 97%, chest pain was reproducible on palpation of left chest, lungs with no wheezing, abdomen not distended or tender and mild edema at the bilateral ankles.  ? ?Na 138, K 4.0 Cl 101, bicarbonate at 27, glucose 127 bun 8 cr 0,6 ?Wbc 9,7 hgb 16,5 plt 143  ?D dimer 1,4  ? ?Chest radiograph with prominent right pulmonary artery with no infiltrates.  ? ?EKG 63 bpm, left axis deviation, with left bundle branch block, sinus rhythm with premature atrial complexes, q wave V1 to V3 with no significant ST segment or T wave changes.  ? ?Patient had further work up with cardiac CT with no critical coronary artery disease and recommendation for medical therapy.  ?[place] lower extremities with no DVT ? ?Pending lung V/Q scan low probability for pulmonary embolism.  ?

## 2022-01-01 NOTE — Progress Notes (Signed)
?Progress Note ? ? ?Patient: Jasmine Crawford YTK:[id number] DOB: 1940/12/06 DOA: 12/31/2021     0 ?DOS: the patient was seen and examined on 01/01/2022 ?  ?Brief hospital course: ?Jasmine Crawford was admitted to the hospital with the working diagnosis of chest pain.  ? ?81 yo female with the past medical history of dementia, hypertension, T2DM, and hypothyroidism who presented with chest pain. Patient reported intermittent chest pain, precordial, non exertion related, associated with dyspnea, nausea and diaphoresis. On the day of her hospitalization she had a severe chest pain that prompted her to come to the hospital. On her initial physical examination her blood pressure was 182/92, HR 61, RR 16, 02 saturation 97%, chest pain was reproducible on palpation of left chest, lungs with no wheezing, abdomen not distended or tender and mild edema at the bilateral ankles.  ? ?Na 138, K 4.0 Cl 101, bicarbonate at 27, glucose 127 bun 8 cr 0,6 ?Wbc 9,7 hgb 16,5 plt 143  ?D dimer 1,4  ? ?Chest radiograph with prominent right pulmonary artery with no infiltrates.  ? ?EKG 63 bpm, left axis deviation, with left bundle branch block, sinus rhythm with premature atrial complexes, q wave V1 to V3 with no significant ST segment or T wave changes.  ? ? ? ?Assessment and Plan: ?* Chest pain ?Patient complains of neck pain on the right side which is reproducible to touch.  ?Positive headache, with no dyspnea. ?Chest pain is left side, left breast.  ?D dimer is elevated.  ? ?High sensitive troponin are negative  ?EKG with no signs of acute ischemia but with precordial anterior Q waves. ?Positive d dimer.  ? ?Plan to continue pain control with oxycodone and Iv morphine ?Add tid cyclobenzaprine. ?Check [place] lower extremities rule out DVT.  ?Follow up with cardiology recommendations for cardiac CT. ? ? ?Hypertensive urgency ?Blood pressure is improved, this am systolic is 107 to 122 mmHg. ?Plan to continue blood pressure control with atenolol and  hydrochlorothiazide.  ? ? ?Type 2 diabetes mellitus with hyperlipidemia ([facility]) ?Uncontrolled T2DM due to high Hgb A1c ? ?Her fasting glucose this am is 125. ?Continue glucose cover and monitoring with insulin sliding scale.  ?Continue with statin therapy.  ? ?Thrombocytopenia ([facility]) ?Resolved with plt at 209. ? ?Peripheral arterial disease ([facility]) ?On aspirin and statin therapy.  ? ?Hypothyroidism ?Continue with levothyroxine.  ? ?Dementia ([facility]) ?-Continue donezepil ? ?Class 1 obesity ?Calculated BMI 32,9  ? ? ? ? ?  ? ?Subjective: patient with no dyspnea, positive headache and neck pain on the right side.  ? ?Physical Exam: ?Vitals:  ? 01/01/22 0135 01/01/22 0509 01/01/22 0743 01/01/22 1202  ?BP: (!) 145/87 140/78 110/67 135/65  ?Pulse: 64 (!) 57 62 (!) 59  ?Resp: 17 16 20 18   ?Temp: (!) 97.5 ?F (36.4 ?C) 97.6 ?F (36.4 ?C) 98 ?F (36.7 ?C) 97.8 ?F (36.6 ?C)  ?TempSrc: Oral Oral Oral Oral  ?SpO2: 98%  99% 100%  ?Weight: 84.3 kg     ?Height: 5\' 3"  (1.6 m)     ? ?Neurology awake and alert ?ENT with no pallor ?Cardiovascular with S1 and S2 present and rhythmic with no gallops, or rubs ?No murmurs ?Respiratory with no rales or wheezing ?Abdomen not distended ?Musculoskeletal with positive tenderness to palpation to right shoulder right neck. ?Positive tender over left breast  ?Data Reviewed: ? ? ? ?Family Communication: I spoke with patient's daughter  at the bedside, we talked in detail about patient's condition, plan of care and prognosis  and all questions were addressed. ? ? ?Disposition: ?Status is: Observation ?The patient remains OBS appropriate and will d/c before 2 midnights. ? Planned Discharge Destination: Home ? ?Author: ?Coralie Keens, MD ?01/01/2022 2:15 PM ? ?For on call review [email or web address].  ?

## 2022-01-01 NOTE — Progress Notes (Signed)
Lower extremity venous has been completed.  ? ?Preliminary results in CV Proc.  ? ?Jasmine Crawford Roselene Gray ?01/01/2022 3:34 PM    ?

## 2022-01-02 ENCOUNTER — Inpatient Hospital Stay (HOSPITAL_COMMUNITY): Payer: Medicare HMO

## 2022-01-02 ENCOUNTER — Encounter (HOSPITAL_COMMUNITY)
Admission: EM | Disposition: A | Discharge status: Home / Self Care | Payer: Self-pay | Source: Home / Self Care | Attending: Emergency Medicine

## 2022-01-02 ENCOUNTER — Observation Stay (HOSPITAL_COMMUNITY): Admission: RE | Admit: 2022-01-02 | Payer: Medicare HMO | Source: Home / Self Care | Admitting: Cardiology

## 2022-01-02 ENCOUNTER — Observation Stay (HOSPITAL_COMMUNITY): Payer: Medicare HMO

## 2022-01-02 DIAGNOSIS — I208 Other forms of angina pectoris: Secondary | ICD-10-CM | POA: Diagnosis not present

## 2022-01-02 DIAGNOSIS — E669 Obesity, unspecified: Secondary | ICD-10-CM | POA: Diagnosis not present

## 2022-01-02 DIAGNOSIS — F0390 Unspecified dementia without behavioral disturbance: Secondary | ICD-10-CM | POA: Diagnosis not present

## 2022-01-02 DIAGNOSIS — I739 Peripheral vascular disease, unspecified: Secondary | ICD-10-CM | POA: Diagnosis not present

## 2022-01-02 DIAGNOSIS — I160 Hypertensive urgency: Secondary | ICD-10-CM | POA: Diagnosis not present

## 2022-01-02 LAB — GLUCOSE, CAPILLARY
Glucose-Capillary: 136 mg/dL — ABNORMAL HIGH (ref 70–99)
Glucose-Capillary: 170 mg/dL — ABNORMAL HIGH (ref 70–99)
Glucose-Capillary: 177 mg/dL — ABNORMAL HIGH (ref 70–99)
Glucose-Capillary: 186 mg/dL — ABNORMAL HIGH (ref 70–99)
Glucose-Capillary: 221 mg/dL — ABNORMAL HIGH (ref 70–99)
Glucose-Capillary: 238 mg/dL — ABNORMAL HIGH (ref 70–99)

## 2022-01-02 SURGERY — LEFT HEART CATH AND CORONARY ANGIOGRAPHY
Anesthesia: LOCAL

## 2022-01-02 MED ORDER — TECHNETIUM TO 99M ALBUMIN AGGREGATED
4.0000 | Freq: Once | INTRAVENOUS | Status: AC | PRN
  Administered 2022-01-02: 4 via INTRAVENOUS

## 2022-01-02 NOTE — Progress Notes (Addendum)
? ?Progress Note ? ?Patient Name: Jasmine Crawford ?Date of Encounter: 01/02/2022 ? ?[facility] Cardiologist: New to Dr Tenny Craw ? ?Subjective  ? ?Patient states she is well, she had a little "head pressure" waking up, denied any chest pain, SOB. She states she does not wish for any cardiac cath. She states her daughter is well aware of her wishes regarding conservative medical management.  ? ?Inpatient Medications  ?  ?Scheduled Meds: ? amLODipine  5 mg Oral QHS  ? aspirin EC  81 mg Oral Daily  ? atenolol  75 mg Oral QHS  ? cyclobenzaprine  5 mg Oral TID  ? donepezil  5 mg Oral QHS  ? enoxaparin (LOVENOX) injection  40 mg Subcutaneous Daily  ? hydrochlorothiazide  25 mg Oral QHS  ? insulin aspart  0-9 Units Subcutaneous Q4H  ? rosuvastatin  20 mg Oral Daily  ? ?Continuous Infusions: ? ?PRN Meds: ?acetaminophen, morphine injection, oxyCODONE  ? ?Vital Signs  ?  ?Vitals:  ? 01/01/22 1202 01/01/22 2121 01/02/22 0432 01/02/22 0700  ?BP: 135/65 (!) 157/73 127/65 (!) 156/79  ?Pulse: (!) 59 62 (!) 57 60  ?Resp: 18 16 15 15   ?Temp: 97.8 ?F (36.6 ?C) 98.3 ?F (36.8 ?C) 98 ?F (36.7 ?C) 98.1 ?F (36.7 ?C)  ?TempSrc: Oral Oral Oral Oral  ?SpO2: 100% 99%  99%  ?Weight:      ?Height:      ? ? ?Intake/Output Summary (Last 24 hours) at 01/02/2022 0818 ?Last data filed at 01/02/2022 0444 ?Gross per 24 hour  ?Intake 480 ml  ?Output 1 ml  ?Net 479 ml  ? ? ?  01/01/2022  ?  1:35 AM 12/31/2021  ?  6:13 PM  ?Last 3 Weights  ?Weight (lbs) 185 lb 14.4 oz 195 lb  ?Weight (kg) 84.324 kg 88.451 kg  ?   ? ?Telemetry  ?  ?Sinus bradycardia/rhythm with VR 56-78 bpm, PACs and PVCs- Personally Reviewed ? ?ECG  ?  ?No new tracing today - Personally Reviewed ? ?Physical Exam  ? ?GEN: No acute distress.   ?Neck: No JVD ?Cardiac: RRR, no murmurs, rubs, or gallops.  ?Respiratory: Clear to auscultation bilaterally. On room air.  ?GI: Soft, nontender, non-distended  ?MS: No BLE edema  ?Psych: Normal affect  ? ?Labs  ?  ?High Sensitivity Troponin:   ?Recent Labs   ?Lab 12/31/21 ?2033 12/31/21 ?2230 01/01/22 ?0427 01/01/22 ?03/03/22  ?TROPONINIHS 10 16 11 11   ?   ?Chemistry ?Recent Labs  ?Lab 12/31/21 ?2033  ?NA 138  ?K 4.0  ?CL 101  ?CO2 27  ?GLUCOSE 125*  ?BUN 8  ?CREATININE 0.66  ?CALCIUM 10.1  ?GFRNONAA >60  ?ANIONGAP 10  ?  ?Lipids  ?Recent Labs  ?Lab 01/01/22 ?0113  ?CHOL 162  ?TRIG 122  ?HDL 53  ?LDLCALC 85  ?CHOLHDL 3.1  ?  ?Hematology ?Recent Labs  ?Lab 12/31/21 ?1851 01/01/22 ?0113  ?WBC 9.7 10.6*  ?RBC 5.13* 5.00  ?HGB 16.5* 15.8*  ?HCT 47.7* 45.8  ?MCV 93.0 91.6  ?MCH 32.2 31.6  ?MCHC 34.6 34.5  ?RDW 13.5 13.5  ?PLT 143* 209  ? ?Thyroid  ?Recent Labs  ?Lab 01/01/22 ?0427  ?TSH 1.322  ?  ?BNPNo results for input(s): BNP, PROBNP in the last 168 hours.  ?DDimer  ?Recent Labs  ?Lab 01/01/22 ?0113  ?DDIMER 1.40*  ?  ? ?Radiology  ?  ?CT ANGIO HEAD NECK W WO CM ? ?Result Date: 12/31/2021 ?CLINICAL DATA:  Posterior headache and neck pain  EXAM: CT ANGIOGRAPHY HEAD AND NECK TECHNIQUE: Multidetector CT imaging of the head and neck was performed using the standard protocol during bolus administration of intravenous contrast. Multiplanar CT image reconstructions and MIPs were obtained to evaluate the vascular anatomy. Carotid stenosis measurements (when applicable) are obtained utilizing NASCET criteria, using the distal internal carotid diameter as the denominator. RADIATION DOSE REDUCTION: This exam was performed according to the departmental dose-optimization program which includes automated exposure control, adjustment of the mA and/or kV according to patient size and/or use of iterative reconstruction technique. CONTRAST:  100mL OMNIPAQUE IOHEXOL 350 MG/ML SOLN COMPARISON:  None. FINDINGS: CT HEAD FINDINGS Brain: There is no mass, hemorrhage or extra-axial collection. There is advanced generalized atrophy without lobar predilection. There is hypoattenuation of the periventricular white matter, most commonly indicating chronic ischemic microangiopathy. Skull: The visualized  skull base, calvarium and extracranial soft tissues are normal. Sinuses/Orbits: No fluid levels or advanced mucosal thickening of the visualized paranasal sinuses. No mastoid or middle ear effusion. The orbits are normal. CTA NECK FINDINGS SKELETON: There is no bony spinal canal stenosis. No lytic or blastic lesion. OTHER NECK: Normal pharynx, larynx and major salivary glands. No cervical lymphadenopathy. 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 UPPER CHEST: No pneumothorax or pleural effusion. No nodules or masses. AORTIC ARCH: There is calcific atherosclerosis of the aortic arch. There is no aneurysm, dissection or hemodynamically significant stenosis of the visualized portion of the aorta. Conventional 3 vessel aortic branching pattern. The visualized proximal subclavian arteries are widely patent. RIGHT CAROTID SYSTEM: Normal without aneurysm, dissection or stenosis. LEFT CAROTID SYSTEM: Normal without aneurysm, dissection or stenosis. VERTEBRAL ARTERIES: Left dominant configuration. Both origins are clearly patent. There is no dissection, occlusion or flow-limiting stenosis to the skull base (V1-V3 segments). CTA HEAD FINDINGS POSTERIOR CIRCULATION: --Vertebral arteries: Normal V4 segments. --Inferior cerebellar arteries: Normal. --Basilar artery: Normal. --Superior cerebellar arteries: Normal. --Posterior cerebral arteries (PCA): Normal. ANTERIOR CIRCULATION: --Intracranial internal carotid arteries: Normal. --Anterior cerebral arteries (ACA): Normal. Both A1 segments are present. Patent anterior communicating artery (a-comm). --Middle cerebral arteries (MCA): Normal. VENOUS SINUSES: As permitted by contrast timing, patent. ANATOMIC VARIANTS: Both P comms are patent. Review of the MIP images confirms the above findings. IMPRESSION: 1. No emergent large vessel occlusion or high-grade stenosis of the head or neck. 2. 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 Aortic Atherosclerosis  (ICD10-I70.0). Electronically Signed   By: Deatra RobinsonKevin  Herman M.D.   On: 12/31/2021 22:27  ? ?CT CORONARY MORPH W/CTA COR W/SCORE W/CA W/CM &/OR WO/CM ? ?Addendum Date: 01/01/2022   ?ADDENDUM REPORT: 01/01/2022 17:04 CLINICAL DATA:  Chest pain EXAM: Cardiac/Coronary CTA TECHNIQUE: A non-contrast, gated CT scan was obtained with axial slices of 3 mm through the heart for calcium scoring. Calcium scoring was performed using the Agatston method. A 120 kV prospective, gated, contrast cardiac scan was obtained. Gantry rotation speed was 250 msecs and collimation was 0.6 mm. Two sublingual nitroglycerin tablets (0.8 mg) were given. The 3D data set was reconstructed in 5% intervals of the 35-75% of the R-R cycle. Diastolic phases were analyzed on a dedicated workstation using MPR, MIP, and VRT modes. The patient received 95 cc of contrast. FINDINGS: Image quality: Excellent. Noise artifact is: Limited. Coronary Arteries:  Normal coronary origin.  Right dominance. Left main: The left main is a large caliber vessel with a normal take off from the left coronary cusp that bifurcates to form a left anterior descending artery and a left circumflex artery. There is concern for  moderate non-calcified plaque (50-69%). Left anterior descending artery: The proximal LAD contains mild mixed density plaque (25-49%). The mid LAD contains mild non-calcified plaque (25-49%). The distal LAD is patent. The LAD is patent without evidence of plaque or stenosis. The LAD gives off 1 patent diagonal branch. Left circumflex artery: The LCX is non-dominant. The proximal LCX contains moderate mixed density plaque (50-69%). The distal LCX contains moderate mixed density plaque (50-69%). OM1 contains mild non-calcified plaque (25-49%). OM2 and OM3 are patent. Right coronary artery: The RCA is dominant with normal take off from the right coronary cusp. The proximal RCA contains mild mixed density plaque (25-49%). The mid and distal segments contains minimal  CAD (<25%). The RCA terminates as a PDA and right posterolateral branch. The PDA is patent. The PLV contains mild non-calcified plaque (25-49%). Right Atrium: Right atrial size is within normal limits. Right Ventr

## 2022-01-02 NOTE — TOC Transition Note (Addendum)
Transition of Care (TOC) - CM/SW Discharge Note ? ? ?Patient Details  ?Name: Jasmine Crawford ?MRN: [id number] ?Date of Birth: January 04, 1941 ? ?Transition of Care (TOC) CM/SW Contact:  ?Zenon Mayo, RN ?Phone Number: ?01/02/2022, 5:10 PM ? ? ?Clinical Narrative:    ?Patient will go home with her daughter at discharge and she will go to outpatient physical therapy on [place], NCM made referral thru epic.  Daughter will transport her home at dc.  [employer] will bring rolling walker to room.   ?  ? ? ?Final next level of care: Home/Self Care ?Barriers to Discharge: Continued Medical Work up ? ? ?Patient Goals and CMS Choice ?Patient states their goals for this hospitalization and ongoing recovery are:: return home with daughter ?  ?Choice offered to / list presented to : NA ? ?Discharge Placement ?  ?           ?  ?  ?  ?  ? ?Discharge Plan and Services ?  ?Discharge Planning Services: CM Consult ?Post Acute Care Choice: Durable Medical Equipment          ?  ?DME Agency: NA ?  ?  ?  ?HH Arranged: NA ?  ?  ?  ?  ? ?Social Determinants of Health (SDOH) Interventions ?  ? ? ?Readmission Risk Interventions ?   ? View : No data to display.  ?  ?  ?  ? ? ? ? ? ?

## 2022-01-02 NOTE — TOC Progression Note (Signed)
Transition of Care (TOC) - Progression Note  ? ? ?Patient Details  ?Name: Jasmine Crawford ?MRN: [id number] ?Date of Birth: [birth year] ? ?Transition of Care (TOC) CM/SW Contact  ?Leone Haven, RN ?Phone Number: ?01/02/2022, 10:20 AM ? ?Clinical Narrative:    ? ?Transition of Care (TOC) Screening Note ? ? ?Patient Details  ?Name: Jasmine Crawford ?Date of Birth: [birth year] ? ? ?Transition of Care (TOC) CM/SW Contact:    ?Leone Haven, RN ?Phone Number: ?01/02/2022, 10:20 AM ? ? ? ?Transition of Care Department [facility]) has reviewed patient and no TOC needs have been identified at this time. We will continue to monitor patient advancement through interdisciplinary progression rounds. If new patient transition needs arise, please place a TOC consult. ?  ? ? ?  ?  ? ?Expected Discharge Plan and Services ?  ?  ?  ?  ?  ?                ?  ?  ?  ?  ?  ?  ?  ?  ?  ?  ? ? ?Social Determinants of Health (SDOH) Interventions ?  ? ?Readmission Risk Interventions ?   ? View : No data to display.  ?  ?  ?  ? ? ?

## 2022-01-02 NOTE — Care Management Obs Status (Signed)
MEDICARE OBSERVATION STATUS NOTIFICATION ? ? ?Patient Details  ?Name: Jasmine Crawford ?MRN: [id number] ?Date of Birth: 04/21/1941 ? ? ?Medicare Observation Status Notification Given:  Yes ? ? ? ?Leone Haven, RN ?01/02/2022, 4:57 PM ?

## 2022-01-02 NOTE — Evaluation (Signed)
Physical Therapy Evaluation ?Patient Details ?Name: Jasmine Crawford ?MRN: [id number] ?DOB: 10/17/1940 ?Today's Date: 01/02/2022 ? ?History of Present Illness ? Pt is 81 yo female admitted on 12/31/21 with chest pain and dyspnea.  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  Pt hs hx of HTN, HLD, DM, PAD, and dementia. ?  ?Clinical Impression ? Pt admitted with above diagnosis. At baseline, pt was living alone with intermittent assist from daughter.  Spoke with daughter and plan is for pt to return home with her at d/c.  In regards to mobility today, pt was able ambulate 160' with RW - did need cues for safety (pt wanting to try without AD but unsteady).  In regards to neck pain -pt symptoms consistent with musculoskeletal dysfunction (pain increases with spine closing motions for R side) - educated on cervical retraction, modalities, and recommendations for outpt PT.  Pt currently with functional limitations due to the deficits listed below (see PT Problem List). Pt will benefit from skilled PT to increase their independence and safety with mobility to allow discharge to the venue listed below.   ?   ?   ? ?Recommendations for follow up therapy are one component of a multi-disciplinary discharge planning process, led by the attending physician.  Recommendations may be updated based on patient status, additional functional criteria and insurance authorization. ? ?Follow Up Recommendations Outpatient PT (for general mobiltiy and neck pain) ? ?  ?Assistance Recommended at Discharge Intermittent Supervision/Assistance  ?Patient can return home with the following ? A little help with walking and/or transfers;A little help with bathing/dressing/bathroom;Assistance with cooking/housework;Direct supervision/assist for medications management;Direct supervision/assist for financial management ? ?  ?Equipment Recommendations  Rolling walker (2 wheels)  ?Recommendations for Other Services ?    ?  ?Functional Status Assessment Patient has had a recent decline in their functional status and demonstrates the ability to make significant improvements in function in a reasonable and predictable amount of time.  ? ?  ?Precautions / Restrictions Precautions ?Precautions: Fall ?Restrictions ?Weight Bearing Restrictions: No  ? ?  ? ?Mobility ? Bed Mobility ?Overal bed mobility: Needs Assistance ?Bed Mobility: Supine to Sit ?  ?  ?Supine to sit: Min assist ?  ?  ?General bed mobility comments: Min HHA to lift trunk ?  ? ?Transfers ?Overall transfer level: Needs assistance ?Equipment used: None, Rolling walker (2 wheels) ?Transfers: Sit to/from Stand ?Sit to Stand: Min guard, Min assist ?  ?  ?  ?  ?  ?General transfer comment: Tried without AD but unsteady and requiring min A to steady; when performed with RW only min guard ?  ? ?Ambulation/Gait ?Ambulation/Gait assistance: Min guard, Min assist ?Gait Distance (Feet): 160 Feet ?Assistive device: Rolling walker (2 wheels), None ?Gait Pattern/deviations: Step-to pattern, Decreased stride length ?Gait velocity: decreased ?  ?  ?General Gait Details: Pt wanted to try without RW - did 20' but unsteady requiring miN A to maintain balance; ambulated with RW for 140' and min guard, improved stability ? ?Stairs ?  ?  ?  ?  ?  ? ?Wheelchair Mobility ?  ? ?Modified Rankin (Stroke Patients Only) ?  ? ?  ? ?Balance Overall balance assessment: Needs assistance ?Sitting-balance support: No upper extremity supported ?Sitting balance-Leahy Scale: Normal ?  ?  ?Standing balance support: No upper extremity supported, Bilateral upper extremity supported, Reliant on assistive device for balance ?Standing balance-Leahy Scale: Fair ?  ?  ?  ?  ?  ?  ?  ?  ?  ?  ?  ?  ?   ? ? ? ?  Pertinent Vitals/Pain Pain Assessment ?Pain Assessment: 0-10 ?Pain Score: 5  ?Pain Location: R neck down into upper trap ?Pain Descriptors /  Indicators: Discomfort, Sore ?Pain Intervention(s): Limited activity within patient's tolerance, Monitored during session, Repositioned  ? ? ?Home Living Family/patient expects to be discharged to:: Private residence ?Living Arrangements: Alone ?Available Help at Discharge: Family;Available 24 hours/day (Pt's daughter plans to take pt home with her for at least 1 month and working on more long term arrangement.) ?Type of Home: House ?Home Access: Stairs to enter ?Entrance Stairs-Rails: None ?Entrance Stairs-Number of Steps: 2 ?  ?Home Layout: One level ?Home Equipment: [place] Mor - single point ?   ?  ?Prior Function Prior Level of Function : Needs assist ?  ?  ?  ?  ?  ?  ?Mobility Comments: Uses cane for short community distances; no AD in house; only 1 fall over 6 months ago ?ADLs Comments: independent with ADLs; family assist IALD ?  ? ? ?Hand Dominance  ?   ? ?  ?Extremity/Trunk Assessment  ? Upper Extremity Assessment ?Upper Extremity Assessment: Overall WFL for tasks assessed ?  ? ?Lower Extremity Assessment ?Lower Extremity Assessment: Overall WFL for tasks assessed ?  ? ?Cervical / Trunk Assessment ?Cervical / Trunk Assessment: Other exceptions ?Cervical / Trunk Exceptions: slight forward head  ?Communication  ? Communication: No difficulties  ?Cognition Arousal/Alertness: Awake/alert ?Behavior During Therapy: [facility] for tasks assessed/performed ?Overall Cognitive Status: History of cognitive impairments - at baseline ?  ?  ?  ?  ?  ?  ?  ?  ?  ?  ?  ?  ?  ?  ?  ?  ?General Comments: oriented to self, place, month but had difficulty with date and year; difficulty providing some details of PLOF/plan - clarified with daughter ?  ?  ? ?  ?General Comments General comments (skin integrity, edema, etc.):  ? ?Neck Pain Assessment:  ? ?Pain on R radiating down upper trap; hurting for about 1 week; pain at 5/10 rest; pt unable to provide relieving or aggravating factor ? ?ROM: pain increased with R lateral flexion, L  rotation, and extension.  Not affected by L lateral flexion, R rotation, or flexion.  ? ?Palpation: noted some tightness in R upper trap compared to L, reports tenderness in R paraspinals cervical region ? ?Performed cervical retraction x 10 with tactile and visual cues - tolerated well.  Recommend hot packs or ice packs for pain control and f/u with outpt therapy.  ? ? ? ?  ?Exercises    ? ?Assessment/Plan  ?  ?PT Assessment Patient needs continued PT services  ?PT Problem List Decreased strength;Decreased mobility;Decreased safety awareness;Decreased range of motion;Decreased activity tolerance;Cardiopulmonary status limiting activity;Decreased balance;Decreased knowledge of use of DME ? ?   ?  ?PT Treatment Interventions DME instruction;Therapeutic activities;Modalities;Gait training;Therapeutic exercise;Patient/family education;Stair training;Balance training;Functional mobility training   ? ?PT Goals (Current goals can be found in the Care Plan section)  ?Acute Rehab PT Goals ?Patient Stated Goal: return home ?PT Goal Formulation: With patient/family ?Time For Goal Achievement: 01/16/22 ?Potential to Achieve Goals: Good ? ?  ?Frequency Min 3X/week ?  ? ? ?Co-evaluation   ?  ?  ?  ?  ? ? ?  ?AM-PAC PT "6 Clicks" Mobility  ?Outcome Measure Help needed turning from your back to your side while in a flat bed without using bedrails?: A Little ?Help needed moving from lying on your back to sitting on the side of a flat  bed without using bedrails?: A Little ?Help needed moving to and from a bed to a chair (including a wheelchair)?: A Little ?Help needed standing up from a chair using your arms (e.g., wheelchair or bedside chair)?: A Little ?Help needed to walk in hospital room?: A Little ?Help needed climbing 3-5 steps with a railing? : A Little ?6 Click Score: 18 ? ?  ?End of Session Equipment Utilized During Treatment: Gait belt ?Activity Tolerance: Patient tolerated treatment well ?Patient left: in bed;with call  bell/phone within reach;with bed alarm set ?Nurse Communication: Mobility status ?PT Visit Diagnosis: Other abnormalities of gait and mobility (R26.89);Pain ?Pain - Right/Left: Right ?Pain - part of body:  (neck)

## 2022-01-02 NOTE — Progress Notes (Signed)
?Progress Note ? ? ?Patient: Jasmine Crawford [id number] DOB: May 01, 1941 DOA: 12/31/2021     1 ?DOS: the patient was seen and examined on 01/02/2022 ?  ?Brief hospital course: ?Jasmine Crawford was admitted to the hospital with the working diagnosis of chest pain.  ? ?81 yo female with the past medical history of dementia, hypertension, T2DM, and hypothyroidism who presented with chest pain. Patient reported intermittent chest pain, precordial, non exertion related, associated with dyspnea, nausea and diaphoresis. On the day of her hospitalization she had a severe chest pain that prompted her to come to the hospital. On her initial physical examination her blood pressure was 182/92, HR 61, RR 16, 02 saturation 97%, chest pain was reproducible on palpation of left chest, lungs with no wheezing, abdomen not distended or tender and mild edema at the bilateral ankles.  ? ?Na 138, K 4.0 Cl 101, bicarbonate at 27, glucose 127 bun 8 cr 0,6 ?Wbc 9,7 hgb 16,5 plt 143  ?D dimer 1,4  ? ?Chest radiograph with prominent right pulmonary artery with no infiltrates.  ? ?EKG 63 bpm, left axis deviation, with left bundle branch block, sinus rhythm with premature atrial complexes, q wave V1 to V3 with no significant ST segment or T wave changes.  ? ?Patient had further work up with cardiac CT with no critical coronary artery disease and recommendation for medical therapy.  ?[place] lower extremities with no DVT ? ?Pending lung V/Q scan, if negative will plan to discharge patient home.  ? ?Assessment and Plan: ?* Chest pain ?Improved neck and chest pain but not back to baseline.  ?Continue to have pleuritic chest pain.  ? ?Chest pain is left side, left breast.  ?D dimer is elevated.  ? ?High sensitive troponin are negative  ?EKG with no signs of acute ischemia but with precordial anterior Q waves. ?Positive d dimer.  ? ?Cardiac CT with no critical coronary artery disease ?[place] lower extremities with no DVT ?Will complete work up with lung V/Q  scan. ? ?If negative scan plan to discharge home. ?Continue muscle relaxant and oral analgesics.  ?Out of bed to chair tid with meals.  ? ? ?Hypertensive urgency ?Blood pressure has improved, systolic has been  ?120 to 130 mmHg. ?Plan to continue with amlodipine, atenolol and hydrochlorothiazide.   ? ? ?Type 2 diabetes mellitus with hyperlipidemia (HCC) ?Uncontrolled T2DM due to high Hgb A1c ? ?Her fasting glucose this am is 136 mg/dl  ?Continue glucose cover and monitoring with insulin sliding scale.  ?Continue with statin therapy.  ? ?Thrombocytopenia (HCC) ?Resolved with plt at 209. ? ?Peripheral arterial disease (HCC) ?On aspirin and statin therapy.  ? ?Hypothyroidism ?Continue with levothyroxine.  ? ?Dementia (HCC) ?-Continue donezepil ? ?Class 1 obesity ?Calculated BMI 32,9  ? ? ? ? ?  ? ?Subjective: Patient is feeling better, she continue to have pleuritic chest pain, no dyspnea, no nausea or vomiting/  ? ?Physical Exam: ?Vitals:  ? 01/01/22 2121 01/02/22 0432 01/02/22 0700 01/02/22 1100  ?BP: (!) 157/73 127/65 (!) 156/79 120/66  ?Pulse: 62 (!) 57 60 62  ?Resp: 16 15 15 16   ?Temp: 98.3 ?F (36.8 ?C) 98 ?F (36.7 ?C) 98.1 ?F (36.7 ?C) 98.1 ?F (36.7 ?C)  ?TempSrc: Oral Oral Oral Oral  ?SpO2: 99%  99%   ?Weight:      ?Height:      ? ?Neurology awake and alert ?ENT with no pallor ?Cardiovascular with S1 and S2 present and rhythmic ?Respiratory with no rales or wheezing ?  Abdomen not distended ?No lower extremity edema  ?Data Reviewed: ? ? ? ?Family Communication: no family at the bedside. I spoke over the phone with the patient's  about patient's  condition, plan of care, prognosis and all questions were addressed.  ? ?Disposition: ?Status is: Inpatient ?Remains inpatient appropriate because: chest pain.  ? Planned Discharge Destination: Home ? ? ? ? ?Author: ?Coralie Keens, MD ?01/02/2022 1:33 PM ? ?For on call review [email or web address].  ?

## 2022-01-02 NOTE — Care Management CC44 (Signed)
Condition Code 44 Documentation Completed ? ?Patient Details  ?Name: Jasmine Crawford ?MRN: [id number] ?Date of Birth: 1940/11/02 ? ? ?Condition Code 44 given:  Yes ?Patient signature on Condition Code 44 notice:  Yes ?Documentation of 2 MD's agreement:  Yes ?Code 44 added to claim:  Yes ? ? ? ?Leone Haven, RN ?01/02/2022, 4:57 PM ? ?

## 2022-01-02 NOTE — TOC Progression Note (Signed)
Transition of Care (TOC) - Progression Note  ? ? ?Patient Details  ?Name: Jasmine Crawford ?MRN: [id number] ?Date of Birth: 1940-10-08 ? ?Transition of Care (TOC) CM/SW Contact  ?Leone Haven, RN ?Phone Number: ?01/02/2022, 5:09 PM ? ?Clinical Narrative:    ?Patient will go home with her daughter at discharge and she will go to outpatient physical therapy on [place], NCM made referral thru epic.  Daughter will transport her home at dc.  ? ? ?Expected Discharge Plan: Home/Self Care ?Barriers to Discharge: Continued Medical Work up ? ?Expected Discharge Plan and Services ?Expected Discharge Plan: Home/Self Care ?  ?Discharge Planning Services: CM Consult ?Post Acute Care Choice: Durable Medical Equipment ?Living arrangements for the past 2 months: Single Family Home ?                ?  ?DME Agency: NA ?  ?  ?  ?HH Arranged: NA ?  ?  ?  ?  ? ? ?Social Determinants of Health (SDOH) Interventions ?  ? ?Readmission Risk Interventions ?   ? View : No data to display.  ?  ?  ?  ? ? ?

## 2022-01-03 ENCOUNTER — Other Ambulatory Visit (HOSPITAL_COMMUNITY): Payer: Self-pay

## 2022-01-03 ENCOUNTER — Encounter (HOSPITAL_COMMUNITY): Payer: Self-pay | Admitting: Internal Medicine

## 2022-01-03 DIAGNOSIS — F0390 Unspecified dementia without behavioral disturbance: Secondary | ICD-10-CM | POA: Diagnosis not present

## 2022-01-03 DIAGNOSIS — I739 Peripheral vascular disease, unspecified: Secondary | ICD-10-CM | POA: Diagnosis not present

## 2022-01-03 DIAGNOSIS — I208 Other forms of angina pectoris: Secondary | ICD-10-CM | POA: Diagnosis not present

## 2022-01-03 DIAGNOSIS — E669 Obesity, unspecified: Secondary | ICD-10-CM | POA: Diagnosis not present

## 2022-01-03 DIAGNOSIS — I160 Hypertensive urgency: Secondary | ICD-10-CM | POA: Diagnosis not present

## 2022-01-03 LAB — GLUCOSE, CAPILLARY
Glucose-Capillary: 177 mg/dL — ABNORMAL HIGH (ref 70–99)
Glucose-Capillary: 215 mg/dL — ABNORMAL HIGH (ref 70–99)
Glucose-Capillary: 219 mg/dL — ABNORMAL HIGH (ref 70–99)
Glucose-Capillary: 264 mg/dL — ABNORMAL HIGH (ref 70–99)

## 2022-01-03 MED ORDER — ROSUVASTATIN CALCIUM 20 MG PO TABS
20.0000 mg | ORAL_TABLET | Freq: Every day | ORAL | 0 refills | Status: AC
  Filled 2022-01-03: qty 30, 30d supply, fill #0

## 2022-01-03 MED ORDER — CYCLOBENZAPRINE HCL 5 MG PO TABS
5.0000 mg | ORAL_TABLET | Freq: Three times a day (TID) | ORAL | 0 refills | Status: DC | PRN
  Filled 2022-01-03: qty 30, 10d supply, fill #0

## 2022-01-03 NOTE — Plan of Care (Signed)
  Problem: Education: Goal: Knowledge of General Education information will improve Description: Including pain rating scale, medication(s)/side effects and non-pharmacologic comfort measures Outcome: Adequate for Discharge   Problem: Health Behavior/Discharge Planning: Goal: Ability to manage health-related needs will improve Outcome: Adequate for Discharge   Problem: Clinical Measurements: Goal: Ability to maintain clinical measurements within normal limits will improve Outcome: Adequate for Discharge Goal: Will remain free from infection Outcome: Adequate for Discharge Goal: Diagnostic test results will improve Outcome: Adequate for Discharge Goal: Respiratory complications will improve Outcome: Adequate for Discharge Goal: Cardiovascular complication will be avoided Outcome: Adequate for Discharge   Problem: Activity: Goal: Risk for activity intolerance will decrease Outcome: Adequate for Discharge   Problem: Nutrition: Goal: Adequate nutrition will be maintained Outcome: Adequate for Discharge   Problem: Coping: Goal: Level of anxiety will decrease Outcome: Adequate for Discharge   Problem: Elimination: Goal: Will not experience complications related to bowel motility Outcome: Adequate for Discharge Goal: Will not experience complications related to urinary retention Outcome: Adequate for Discharge   Problem: Pain Managment: Goal: General experience of comfort will improve Outcome: Adequate for Discharge   Problem: Safety: Goal: Ability to remain free from injury will improve Outcome: Adequate for Discharge   Problem: Skin Integrity: Goal: Risk for impaired skin integrity will decrease Outcome: Adequate for Discharge   Problem: Acute Rehab PT Goals(only PT should resolve) Goal: Pt Will Go Supine/Side To Sit Outcome: Adequate for Discharge Goal: Pt Will Go Sit To Supine/Side Outcome: Adequate for Discharge Goal: Patient Will Transfer Sit To/From  Stand Outcome: Adequate for Discharge Goal: Pt Will Transfer Bed To Chair/Chair To Bed Outcome: Adequate for Discharge Goal: Pt Will Ambulate Outcome: Adequate for Discharge   

## 2022-01-03 NOTE — Progress Notes (Signed)
Gave patients daughter Tad Moore discharge instructions via phone call. ?

## 2022-01-03 NOTE — Discharge Summary (Signed)
?Physician Discharge Summary ?  ?Patient: Jasmine Crawford MRN: [id number] DOB: [birth year]  ?Admit date:     12/31/2021  ?Discharge date: 01/03/22  ?Discharge Physician: Jimmy Picket Mussa Groesbeck  ? ?PCP: Durward Parcel, MD  ? ?Recommendations at discharge:  ? ? Patient ruled out for acute coronary syndrome. ?Continue as needed cyclobenzaprine for muscle spasms.  ?Out patient physical therapy.  ? ?Discharge Diagnoses: ?Principal Problem: ?  Chest pain ?Active Problems: ?  Hypertensive urgency ?  Type 2 diabetes mellitus with hyperlipidemia ([place]) ?  Thrombocytopenia ([place]) ?  Peripheral arterial disease ([place]) ?  Hypothyroidism ?  Dementia ([place]) ?  Class 1 obesity ? ?Resolved Problems: ?  * No resolved hospital problems. * ? ?Hospital Course: ?Mrs. Jasmine Crawford was admitted to the hospital with the working diagnosis of chest pain.  ? ?81 yo female with the past medical history of dementia, hypertension, T2DM, and hypothyroidism who presented with chest pain. Patient reported intermittent chest pain, precordial, non exertion related, associated with dyspnea, nausea and diaphoresis. On the day of her hospitalization she had a severe chest pain that prompted her to come to the hospital. On her initial physical examination her blood pressure was 182/92, HR 61, RR 16, 02 saturation 97%, chest pain was reproducible on palpation of left chest, lungs with no wheezing, abdomen not distended or tender and mild edema at the bilateral ankles.  ? ?Na 138, K 4.0 Cl 101, bicarbonate at 27, glucose 127 bun 8 cr 0,6 ?Wbc 9,7 hgb 16,5 plt 143  ?D dimer 1,4  ? ?Chest radiograph with prominent right pulmonary artery with no infiltrates.  ? ?EKG 63 bpm, left axis deviation, with left bundle branch block, sinus rhythm with premature atrial complexes, q wave V1 to V3 with no significant ST segment or T wave changes.  ? ?Patient had further work up with cardiac CT with no critical coronary artery disease and recommendation for medical therapy.  ?[place] lower  extremities with no DVT ? ?Pending lung V/Q scan low probability for pulmonary embolism.  ? ?Assessment and Plan: ?* Chest pain ?Patient was admitted to the cardiac ward and was placed on a telemetry monitor. ?Acute coronary syndrome was ruled out.  ? ?High sensitive troponin are negative  ?EKG with no signs of acute ischemia but with precordial anterior Q waves. ?Positive d dimer.  ? ?Echocardiogram with preserved LV systolic function with EF 60 to 65%, mild LVH, right ventricle with preserved systolic function, no significant valvular disease.  ? ?Cardiac CT with calcium score of 248, in the 63rd percentile for age, sex and race match controls.  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 ?[place] lower extremities with no DVT ?Lung V/Q scan with low probability for pulmonary embolism.  ? 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  ?Patient was placed on muscle relaxant and oral analgesics with improvement in her symptoms.  ? ? ?Hypertensive urgency ?Patient was placed on amlodipine, atenolol and hydrochlorothiazide.   ?Her discharge blood pressure 130/72 mmHg.  ? ? ?Type 2 diabetes mellitus with hyperlipidemia ([place]) ?Uncontrolled T2DM due to high Hgb A1c ? ?Patient was placed on insulin sliding scale for glucose cover and monitoring.  ?Her discharge fasting glucose 215. ?At discharge she will resume her usual diabetic regimen.  ?Follow up as outpatient.  ? ?Continue with statin therapy.  ? ?Thrombocytopenia ([place]) ?Resolved with plt at 209. ? ?Peripheral arterial disease ([place]) ?On aspirin and statin therapy.  ? ?  Hypothyroidism ?Continue with levothyroxine.  ? ?Dementia ([place]) ?On donezepil ? ?Class 1 obesity ?Calculated BMI 32,9  ? ? ? ? ?  ? ? ?Consultants: cardiology  ?Procedures performed: none   ?Disposition: Home ?Diet recommendation:  ?Cardiac diet ?DISCHARGE MEDICATION: ?Allergies as of 01/03/2022   ? ?   Reactions  ? Bacitracin  Swelling, Other (See Comments)  ? No swelling of the throat  ? Erythromycin Base Swelling, Other (See Comments)  ? No swelling of the throat  ? Losartan Other (See Comments)  ? Reaction not recalled  ? Ramipril Other (See Comments)  ? Reaction not recalled  ? ?  ? ?  ?Medication List  ?  ? ?STOP taking these medications   ? ?UNKNOWN TO PATIENT ?  ? ?  ? ?TAKE these medications   ? ?albuterol 108 (90 Base) MCG/ACT inhaler ?Commonly known as: VENTOLIN HFA ?Inhale 1-2 puffs into the lungs every 6 (six) hours as needed for wheezing or shortness of breath. ?  ?amLODipine 5 MG tablet ?Commonly known as: NORVASC ?Take 5 mg by mouth at bedtime. ?  ?ammonium lactate 12 % lotion ?Commonly known as: LAC-HYDRIN ?Apply 1 application. topically as needed for dry skin. ?  ?aspirin EC 81 MG tablet ?Take 81 mg by mouth daily. Swallow whole. ?  ?atenolol 25 MG tablet ?Commonly known as: TENORMIN ?Take 75 mg by mouth at bedtime. ?  ?cyclobenzaprine 5 MG tablet ?Commonly known as: FLEXERIL ?Take 1 tablet (5 mg total) by mouth 3 (three) times daily as needed for muscle spasms (neck pain.). ?  ?donepezil 5 MG tablet ?Commonly known as: ARICEPT ?Take 5 mg by mouth at bedtime. ?  ?fluticasone 50 MCG/ACT nasal spray ?Commonly known as: FLONASE ?Place 1 spray into both nostrils 2 (two) times daily as needed for allergies or rhinitis. ?  ?hydrochlorothiazide 25 MG tablet ?Commonly known as: HYDRODIURIL ?Take 25 mg by mouth at bedtime. ?  ?metFORMIN 500 MG 24 hr tablet ?Commonly known as: GLUCOPHAGE-XR ?Take 500 mg by mouth in the morning and at bedtime. ?  ?naproxen 500 MG tablet ?Commonly known as: NAPROSYN ?Take 500 mg by mouth 2 (two) times daily as needed for mild pain. ?  ?rosuvastatin 20 MG tablet ?Commonly known as: CRESTOR ?Take 1 tablet (20 mg total) by mouth daily. ?  ? ?  ? ?  ?  ? ? ?  ?Durable Medical Equipment  ?(From admission, onward)  ?  ? ? ?  ? ?  Start     Ordered  ? 01/02/22 1256  For home use only DME Walker rolling   Once       ?Question Answer Comment  ?Walker: With 5 Inch Wheels   ?Patient needs a walker to treat with the following condition Weakness   ?  ? 01/02/22 1255  ? ?  ?  ? ?  ? ? Follow-up Information   ? ? [facility] Follow up.   ?Why: rolling walker ?Contact information: ?[address] ?[place] [address] ?[phone] ? ? ?  ?  ? ? [facility] Follow up.   ?Specialty: Rehabilitation ?Why: please call them to get apt times for outpatient physical therapy ?Contact information: ?[address] ?[id number] mc ?[place] ?[phone] ? ?  ?  ? ?  ?  ? ?  ? ?Discharge Exam: ?Filed Weights  ? 12/31/21 1813 01/01/22 0135  ?Weight: 88.5 kg 84.3 kg  ? ?Patient is feeling better, neck pain and  chest pain have improved . ? ?BP 130/72 (BP Location: Right Arm)   Pulse 81   Temp 98.2 ?F (36.8 ?C) (Oral)   Resp 19   Ht 5\' 3"  (1.6 m)   Wt 84.3 kg   SpO2 100%   BMI 32.93 kg/m?  ? ?Neurology awake and alert ?ENT with no pallor ?Cardiovascular with S1 and S2 present with no gallops, rubs or murmurs. Regular rhythm. ?Respiratory with no rales ?Abdomen not distended ?No lower extremity edema  ? ?Condition at discharge: stable ? ?The results of significant diagnostics from this hospitalization (including imaging, microbiology, ancillary and laboratory) are listed below for reference.  ? ?Imaging Studies: ?CT ANGIO HEAD NECK W WO CM ? ?Result Date: 12/31/2021 ?CLINICAL DATA:  Posterior headache and neck pain EXAM: CT ANGIOGRAPHY HEAD AND NECK TECHNIQUE: Multidetector CT imaging of the head and neck was performed using the standard protocol during bolus administration of intravenous contrast. Multiplanar CT image reconstructions and MIPs were obtained to evaluate the vascular anatomy. Carotid stenosis measurements (when applicable) are obtained utilizing NASCET criteria, using the distal internal carotid diameter as the denominator. RADIATION DOSE  REDUCTION: This exam was performed according to the departmental dose-optimization program which includes automated exposure control, adjustment of the mA and/or kV according to patient size and/or use of iterative reconstr

## 2022-01-03 NOTE — Progress Notes (Addendum)
? ?Progress Note ? ?Patient Name: Jasmine Crawford ?Date of Encounter: 01/03/2022 ? ?[facility] Cardiologist: New to Dr Tenny Craw ? ?Subjective  ? ?Patient states she feels better, her neck is little sore, headache is mild now, denied any chest pain, SOB. ?Called patient's daughter, agreeable with following up with our cardiology service post discharge. She walked some distance with PT yesterday.  ? ?Inpatient Medications  ?  ?Scheduled Meds: ? amLODipine  5 mg Oral QHS  ? aspirin EC  81 mg Oral Daily  ? atenolol  75 mg Oral QHS  ? cyclobenzaprine  5 mg Oral TID  ? donepezil  5 mg Oral QHS  ? enoxaparin (LOVENOX) injection  40 mg Subcutaneous Daily  ? hydrochlorothiazide  25 mg Oral QHS  ? insulin aspart  0-9 Units Subcutaneous Q4H  ? rosuvastatin  20 mg Oral Daily  ? ?Continuous Infusions: ? ?PRN Meds: ?acetaminophen, morphine injection, oxyCODONE  ? ?Vital Signs  ?  ?Vitals:  ? 01/02/22 0700 01/02/22 1100 01/02/22 1614 01/02/22 2112  ?BP: (!) 156/79 120/66 124/75 130/72  ?Pulse: 60 62  81  ?Resp: 15 16 (!) 21 19  ?Temp: 98.1 ?F (36.7 ?C) 98.1 ?F (36.7 ?C)  98.2 ?F (36.8 ?C)  ?TempSrc: Oral Oral  Oral  ?SpO2: 99%   100%  ?Weight:      ?Height:      ? ?No intake or output data in the 24 hours ending 01/03/22 0835 ? ? ?  01/01/2022  ?  1:35 AM 12/31/2021  ?  6:13 PM  ?Last 3 Weights  ?Weight (lbs) 185 lb 14.4 oz 195 lb  ?Weight (kg) 84.324 kg 88.451 kg  ?   ? ?Telemetry  ?  ?Sinus bradycardia/rhythm with ventricular rate of 51-75 bpm, occasional PACs and PVCs- Personally Reviewed ? ?ECG  ?  ?No new tracing today - Personally Reviewed ? ?Physical Exam  ? ?GEN: No acute distress.   ?Neck: No JVD ?Cardiac: RRR, no murmurs, rubs, or gallops.  ?Respiratory: Clear to auscultation bilaterally. On room air.  ?GI: Soft, nontender, non-distended  ?MS: No BLE edema  ?Psych: Normal affect  ? ?Labs  ?  ?High Sensitivity Troponin:   ?Recent Labs  ?Lab 12/31/21 ?2033 12/31/21 ?2230 01/01/22 ?0427 01/01/22 ?9767  ?TROPONINIHS 10 16 11 11   ?    ?Chemistry ?Recent Labs  ?Lab 12/31/21 ?2033  ?NA 138  ?K 4.0  ?CL 101  ?CO2 27  ?GLUCOSE 125*  ?BUN 8  ?CREATININE 0.66  ?CALCIUM 10.1  ?GFRNONAA >60  ?ANIONGAP 10  ?  ?Lipids  ?Recent Labs  ?Lab 01/01/22 ?0113  ?CHOL 162  ?TRIG 122  ?HDL 53  ?LDLCALC 85  ?CHOLHDL 3.1  ?  ?Hematology ?Recent Labs  ?Lab 12/31/21 ?1851 01/01/22 ?0113  ?WBC 9.7 10.6*  ?RBC 5.13* 5.00  ?HGB 16.5* 15.8*  ?HCT 47.7* 45.8  ?MCV 93.0 91.6  ?MCH 32.2 31.6  ?MCHC 34.6 34.5  ?RDW 13.5 13.5  ?PLT 143* 209  ? ?Thyroid  ?Recent Labs  ?Lab 01/01/22 ?0427  ?TSH 1.322  ?  ?BNPNo results for input(s): BNP, PROBNP in the last 168 hours.  ?DDimer  ?Recent Labs  ?Lab 01/01/22 ?0113  ?DDIMER 1.40*  ?  ? ?Radiology  ?  ?DG Chest 1 View ? ?Result Date: 01/02/2022 ?CLINICAL DATA:  Chest x-ray prior to nuclear medicine study. Shortness of breath and chest pain. EXAM: CHEST  1 VIEW COMPARISON:  None. FINDINGS: The heart size and mediastinal contours are within normal limits. Both  lungs are clear. No acute osseous abnormality. Bilateral acromioclavicular osteoarthritis. IMPRESSION: No focal consolidation or large pleural effusion. Electronically Signed   By: Larose Hires D.O.   On: 01/02/2022 15:34  ? ?NM Pulmonary Perfusion ? ?Result Date: 01/02/2022 ?CLINICAL DATA:  81 year old female with nonspecific chest pain EXAM: NUCLEAR MEDICINE PERFUSION LUNG SCAN TECHNIQUE: Perfusion images were obtained in multiple projections after intravenous injection of radiopharmaceutical. Ventilation scans intentionally deferred if perfusion scan and chest x-ray adequate for interpretation during COVID 19 epidemic. RADIOPHARMACEUTICALS:  Four mCi Tc-69m MAA IV COMPARISON:  Chest x-ray 01/02/2022 FINDINGS: Planar scintigraphic images of the chest performed in multiple projection. No large perfusion defects of the left or right lung identified. IMPRESSION: Negative perfusion scan for evidence of pulmonary embolism. Electronically Signed   By: Gilmer Mor D.O.   On: 01/02/2022  16:08  ? ?CT CORONARY MORPH W/CTA COR W/SCORE W/CA W/CM &/OR WO/CM ? ?Addendum Date: 01/01/2022   ?ADDENDUM REPORT: 01/01/2022 17:04 CLINICAL DATA:  Chest pain EXAM: Cardiac/Coronary CTA TECHNIQUE: A non-contrast, gated CT scan was obtained with axial slices of 3 mm through the heart for calcium scoring. Calcium scoring was performed using the Agatston method. A 120 kV prospective, gated, contrast cardiac scan was obtained. Gantry rotation speed was 250 msecs and collimation was 0.6 mm. Two sublingual nitroglycerin tablets (0.8 mg) were given. The 3D data set was reconstructed in 5% intervals of the 35-75% of the R-R cycle. Diastolic phases were analyzed on a dedicated workstation using MPR, MIP, and VRT modes. The patient received 95 cc of contrast. FINDINGS: Image quality: Excellent. Noise artifact is: Limited. Coronary Arteries:  Normal coronary origin.  Right dominance. Left main: The left main is a large caliber vessel with a normal take off from the left coronary cusp that bifurcates to form a left anterior descending artery and a left circumflex artery. There is concern for moderate non-calcified plaque (50-69%). Left anterior descending artery: The proximal LAD contains mild mixed density plaque (25-49%). The mid LAD contains mild non-calcified plaque (25-49%). The distal LAD is patent. The LAD is patent without evidence of plaque or stenosis. The LAD gives off 1 patent diagonal branch. Left circumflex artery: The LCX is non-dominant. The proximal LCX contains moderate mixed density plaque (50-69%). The distal LCX contains moderate mixed density plaque (50-69%). OM1 contains mild non-calcified plaque (25-49%). OM2 and OM3 are patent. Right coronary artery: The RCA is dominant with normal take off from the right coronary cusp. The proximal RCA contains mild mixed density plaque (25-49%). The mid and distal segments contains minimal CAD (<25%). The RCA terminates as a PDA and right posterolateral branch. The  PDA is patent. The PLV contains mild non-calcified plaque (25-49%). Right Atrium: Right atrial size is within normal limits. Right Ventricle: The right ventricular cavity is within normal limits. Left Atrium: Left atrial size is normal in size with no left atrial appendage filling defect. Left Ventricle: The ventricular cavity size is within normal limits. Pulmonary arteries: Normal in size without proximal filling defect. Pulmonary veins: Normal pulmonary venous drainage. Pericardium: Normal thickness without significant effusion or calcium present. Cardiac valves: The aortic valve is trileaflet without significant calcification. The mitral valve is normal without significant calcification. Aorta: Normal caliber without significant disease. Extra-cardiac findings: See attached radiology report for non-cardiac structures. IMPRESSION: 1. Coronary calcium score of 248. This was 63rd percentile for age-, sex, and race-matched controls. 2. Normal coronary origin with right dominance. 3. 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 4. Moderate LCX stenosis (50-69%).  5. Mild CAD (25-49%) in the LAD/RCA. RECOMMENDATIONS: 1. Severe stenosis. (70-99% or > 50% left main). Cardiac catheterization is recommended. CT FFR will be submitted. Consider symptom-guided anti-ischemic pharmacotherapy as well as risk factor modification per guideline directed care. Invasive coronary angiography recommended with revascularization per published guideline statements. Lennie OdorWesley O'Neal, MD Electronically Signed   By: Lennie OdorWesley  O'Neal M.D.   On: 01/01/2022 17:04  ? ?Result Date: 01/01/2022 ?EXAM: OVER-READ INTERPRETATION  CT CHEST The following report is an over-read performed by radiologist Dr. Richarda [facility]lieAdam Henn [facility] on 01/01/2022. This over-read does not include interpretation of cardiac or coronary anatomy or pathology. The coronary calcium score/coronary CTA interpretation by the cardiologist is attached. COMPARISON:  Chest radiograph  12/31/2021 FINDINGS: Vascular: Normal caliber of the visualized thoracic aorta. No significant pericardial effusion. Focal thinning of the myocardium along the inferior aspect of the left ventricular ape

## 2022-01-03 NOTE — Progress Notes (Signed)
Physical Therapy Treatment ?Patient Details ?Name: Jasmine Crawford ?MRN: [id number] ?DOB: 10-24-1940 ?Today's Date: 01/03/2022 ? ? ?History of Present Illness Pt is 81 yo female admitted on 12/31/21 with chest pain and dyspnea.  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  Pt hs hx of HTN, HLD, DM, PAD, and dementia. ? ?  ?PT Comments  ? ? Pt is making good progress with her mobility and balance, ambulating up to ~240 ft with a RW initially but progressing to a SPC quickly. She does display some mild instability with the SPC, but did not display LOB or need for physical assistance. Educated pt she could use SPC in home but RW on unsteady surfaces in community for safety. Pt with noted tenderness and reproduction of R neck pain with stretching of R upper trap and levator scap muscles, but also with R lateral cervical flexion. Provided MedBridge HEP handout and education on exercises and stretches to manage her neck pain. Will continue to follow acutely. Current recommendations remain appropriate. ?   ?Recommendations for follow up therapy are one component of a multi-disciplinary discharge planning process, led by the attending physician.  Recommendations may be updated based on patient status, additional functional criteria and insurance authorization. ? ?Follow Up Recommendations ? Outpatient PT (for general mobility and neck pain) ?  ?  ?Assistance Recommended at Discharge Intermittent Supervision/Assistance  ?Patient can return home with the following A little help with walking and/or transfers;A little help with bathing/dressing/bathroom;Assistance with cooking/housework;Direct supervision/assist for medications management;Direct supervision/assist for financial management;Assist for transportation;Help with stairs or ramp for entrance ?  ?Equipment Recommendations ? Rolling walker (2 wheels)  ?   ?Recommendations for Other Services   ? ? ?  ?Precautions / Restrictions Precautions ?Precautions: Fall ?Restrictions ?Weight Bearing Restrictions: No  ?  ? ?Mobility ? Bed Mobility ?  ?  ?  ?  ?  ?  ?  ?General bed mobility comments: Pt sitting EOB upon arrival. ?  ? ?Transfers ?Overall transfer level: Needs assistance ?Equipment used: Rolling walker (2 wheels) ?Transfers: Sit to/from Stand ?Sit to Stand: Min guard ?  ?  ?  ?  ?  ?General transfer comment: Min guard for safety, no LOB ?  ? ?Ambulation/Gait ?Ambulation/Gait assistance: Min guard ?Gait Distance (Feet): 240 Feet ?Assistive device: Rolling walker (2 wheels), Straight cane ?Gait Pattern/deviations: Step-to pattern, Decreased stride length ?Gait velocity: reduced ?Gait velocity interpretation: <1.8 ft/sec, indicate of risk for recurrent falls ?  ?General Gait Details: Pt with slow gait. Pt steady with RW, but slightly increased instability with SPC, but no LOB. Pt slows gait when changing head positions when using SPC. Min guard for safety. Educated pt to use RW in community for safety but could use SPC in the home if her balance does not worsen. ? ? ?Stairs ?  ?  ?  ?  ?  ? ? ?Wheelchair Mobility ?  ? ?Modified Rankin (Stroke Patients Only) ?  ? ? ?  ?Balance Overall balance assessment: Needs assistance ?Sitting-balance support: No upper extremity supported ?Sitting balance-Leahy Scale: Normal ?  ?  ?Standing balance support: Reliant on assistive device for balance, Single extremity supported, Bilateral upper extremity supported ?Standing balance-Leahy Scale: Poor ?Standing balance comment: Uses SPC or RW for improved stability ?  ?  ?  ?  ?  ?  ?  ?  ?  ?  ?  ?  ? ?  ?  Cognition Arousal/Alertness: Awake/alert ?Behavior During Therapy: [facility] for tasks assessed/performed ?Overall Cognitive Status: History of cognitive impairments - at baseline ?  ?  ?  ?  ?  ?  ?  ?  ?  ?  ?  ?  ?  ?  ?  ?  ?  ?  ?  ? ?  ?Exercises Other Exercises ?Other Exercises:  Educated pt on self-stretching upper trap and levator scap ?Other Exercises: Provided MedBridge HEP handout and education on levator scap stretch, upper trap stretch, scapular retraction, and cervical retraction ? ?  ?General Comments   ?  ?  ? ?Pertinent Vitals/Pain Pain Assessment ?Pain Assessment: Faces ?Faces Pain Scale: Hurts little more ?Pain Location: R neck down into upper trap/shoulder blade ?Pain Descriptors / Indicators: Discomfort, Sore, Grimacing, Guarding ?Pain Intervention(s): Monitored during session, Limited activity within patient's tolerance, Repositioned, Utilized relaxation techniques (stretches)  ? ? ?Home Living   ?  ?  ?  ?  ?  ?  ?  ?  ?  ?   ?  ?Prior Function    ?  ?  ?   ? ?PT Goals (current goals can now be found in the care plan section) Acute Rehab PT Goals ?Patient Stated Goal: to go home ?PT Goal Formulation: With patient ?Time For Goal Achievement: 01/16/22 ?Potential to Achieve Goals: Good ?Progress towards PT goals: Progressing toward goals ? ?  ?Frequency ? ? ? Min 3X/week ? ? ? ?  ?PT Plan Current plan remains appropriate  ? ? ?Co-evaluation   ?  ?  ?  ?  ? ?  ?AM-PAC PT "6 Clicks" Mobility   ?Outcome Measure ? Help needed turning from your back to your side while in a flat bed without using bedrails?: A Little ?Help needed moving from lying on your back to sitting on the side of a flat bed without using bedrails?: A Little ?Help needed moving to and from a bed to a chair (including a wheelchair)?: A Little ?Help needed standing up from a chair using your arms (e.g., wheelchair or bedside chair)?: A Little ?Help needed to walk in hospital room?: A Little ?Help needed climbing 3-5 steps with a railing? : A Little ?6 Click Score: 18 ? ?  ?End of Session   ?Activity Tolerance: Patient tolerated treatment well ?Patient left: in chair;with call bell/phone within reach ?  ?PT Visit Diagnosis: Other abnormalities of gait and mobility (R26.89);Pain;Unsteadiness on feet  (R26.81);Difficulty in walking, not elsewhere classified (R26.2) ?Pain - Right/Left: Right ?Pain - part of body:  (neck) ?  ? ? ?Time: 5038-8828 ?PT Time Calculation (min) (ACUTE ONLY): 16 min ? ?Charges:  $Therapeutic Activity: 8-22 mins          ?          ? ?Raymond Gurney, PT, DPT ?Acute Rehabilitation Services  ?Pager: [phone] ?Office: [phone] ? ? ? ? ?Jasmine Crawford ?01/03/2022, 11:53 AM ? ?

## 2022-01-08 ENCOUNTER — Telehealth: Payer: Self-pay

## 2022-01-08 NOTE — Telephone Encounter (Signed)
Left a message for the pt to call back.  

## 2022-01-08 NOTE — Telephone Encounter (Signed)
-----   Message from Cyndi Bender, NP sent at 01/02/2022 11:18 AM EDT ----- ?Regarding: appointment ?Hi Cynthie Garmon ? ?Dr Tenny Craw wants to see this patient in 3-4 weeks, can you book her with Dr Tenny Craw only, please? ? ?Thanks  ? ? ?Chapman Fitch  ? ?

## 2022-01-10 ENCOUNTER — Ambulatory Visit: Payer: Medicare HMO

## 2022-01-10 NOTE — Telephone Encounter (Signed)
Left another message for the pt to call back to change her appt.  ?

## 2022-01-17 NOTE — Progress Notes (Signed)
? ?Cardiology Office Note   ? ?Date:  01/24/2022  ? ?ID:  Jasmine Crawford, DOB 30-Aug-1941, MRN [id number] ? ? ?PCP:  Marguerita BeardsKing, Louise, MD ?  ?[facility]  ?Cardiologist:  Jasmine PatesPaula Ross, MD   ?Advanced Practice Provider:  No care team member to display ?Electrophysiologist:  None  ? ?[id number]}  ? ?Chief Complaint  ?Patient presents with  ? Hospitalization Follow-up  ? ? ?History of Present Illness:  ?Jasmine MonsSarah Debella is a 81 y.o. female  with PMH of HTN, HLD, DM, PAD, and dementia, who presented with chest pain with dyspnea on 12/31/21 in the setting of hypertensive urgency.  Troponins negative x4 EKG with left bundle branch block unknown chronicity.  2D echo EF 60 to 65% no wall motion abnormality.  CT coronary study FFR negative, CCTA concerning for left main disease greater than 4%.  After discussion with the patient her wish is for conservative management without cardiac cath.  Blood pressure improved with amlodipine and atenolol and HCTZ. ? ?Patient comes in for f/u with her daughter who she is currently living with. Gets short of breath with activity. Can walk 50-100 feet before dyspnea. BP controlled. Says she has constant chest pressure-hurts to touch ? ?Past Medical History:  ?Diagnosis Date  ? Arthritis   ? Blind left eye   ? Diabetes (HCC)   ? Hyperlipidemia   ? Hypertension   ? Hypothyroid   ? PAD (peripheral artery disease) (HCC)   ? ? ?Past Surgical History:  ?Procedure Laterality Date  ? EYE SURGERY Left   ? had glaucoma  ? ? ?Current Medications: ?Current Meds  ?Medication Sig  ? albuterol (VENTOLIN HFA) 108 (90 Base) MCG/ACT inhaler Inhale 1-2 puffs into the lungs every 6 (six) hours as needed for wheezing or shortness of breath.  ? amLODipine (NORVASC) 10 MG tablet Take 1 tablet by mouth daily.  ? ammonium lactate (LAC-HYDRIN) 12 % lotion Apply 1 application. topically as needed for dry skin.  ? aspirin EC 81 MG tablet Take 81 mg by mouth daily. Swallow whole.  ? atenolol (TENORMIN) 25 MG  tablet Take 75 mg by mouth at bedtime.  ? donepezil (ARICEPT) 10 MG tablet Take by mouth.  ? fluticasone (FLONASE) 50 MCG/ACT nasal spray Place 1 spray into both nostrils 2 (two) times daily as needed for allergies or rhinitis.  ? hydrochlorothiazide (HYDRODIURIL) 25 MG tablet Take 25 mg by mouth at bedtime.  ? metFORMIN (GLUCOPHAGE-XR) 500 MG 24 hr tablet Take 500 mg by mouth in the morning and at bedtime.  ? naproxen (NAPROSYN) 500 MG tablet Take 500 mg by mouth 2 (two) times daily as needed for mild pain.  ? nitroGLYCERIN (NITROSTAT) 0.4 MG SL tablet Place 1 tablet (0.4 mg total) under the tongue every 5 (five) minutes as needed for chest pain.  ? rosuvastatin (CRESTOR) 20 MG tablet Take 1 tablet (20 mg total) by mouth daily.  ?  ? ?Allergies:   Bacitracin, Erythromycin base, Losartan, and Ramipril  ? ?Social History  ? ?Socioeconomic History  ? Marital status: Widowed  ?  Spouse name: Not on file  ? Number of children: Not on file  ? Years of education: Not on file  ? Highest education level: Not on file  ?Occupational History  ? Not on file  ?Tobacco Use  ? Smoking status: Never  ? Smokeless tobacco: Never  ?Vaping Use  ? Vaping Use: Never used  ?Substance and Sexual Activity  ? Alcohol use: Never  ?  Drug use: Never  ? Sexual activity: Never  ?Other Topics Concern  ? Not on file  ?Social History Narrative  ? Not on file  ? ?Social Determinants of Health  ? ?Financial Resource Strain: Not on file  ?Food Insecurity: Not on file  ?Transportation Needs: Not on file  ?Physical Activity: Not on file  ?Stress: Not on file  ?Social Connections: Not on file  ?  ? ?Family History:  The patient's  family history is not on file.  ? ?ROS:   ?Please see the history of present illness.    ?ROS All other systems reviewed and are negative. ? ? ?PHYSICAL EXAM:   ?VS:  BP 136/80   Pulse 66   Ht 5\' 3"  (1.6 m)   Wt 185 lb (83.9 kg)   SpO2 92%   BMI 32.77 kg/m?   ?Physical Exam  ?GEN: Well nourished, well developed, in no acute  distress  ?Neck: no JVD, carotid bruits, or masses ?Cardiac:RRR; no murmurs, rubs, or gallops  ?Respiratory:  clear to auscultation bilaterally, normal work of breathing ?GI: soft, nontender, nondistended, + BS ?Ext: trace ankle edema otherwise without cyanosis, clubbing, Good distal pulses bilaterally ?Neuro:  Alert and Oriented x 3,  ?Psych: euthymic mood, full affect ? ?Wt Readings from Last 3 Encounters:  ?01/24/22 185 lb (83.9 kg)  ?01/01/22 185 lb 14.4 oz (84.3 kg)  ?  ? ? ?Studies/Labs Reviewed:  ? ?EKG:  EKG is not ordered today.  ? ?Recent Labs: ?12/31/2021: BUN 8; Creatinine, Ser 0.66; Potassium 4.0; Sodium 138 ?01/01/2022: Hemoglobin 15.8; Platelets 209; TSH 1.322  ? ?Lipid Panel ?   ?Component Value Date/Time  ? CHOL 162 01/01/2022 0113  ? TRIG 122 01/01/2022 0113  ? HDL 53 01/01/2022 0113  ? CHOLHDL 3.1 01/01/2022 0113  ? VLDL 24 01/01/2022 0113  ? LDLCALC 85 01/01/2022 0113  ? ? ?Additional studies/ records that were reviewed today include:  ? CCTA 01/01/22 ?03/03/22 CT FFR >0.80: Low likelihood of hemodynamic significance. ?2. CT FFR 0.76-0.80: Borderline likelihood of hemodynamic ?significance. ?3. CT FFR =< 0.75: High likelihood of hemodynamic significance. ?  ?*Coronary CT Angiography-derived Fractional Flow Reserve Testing in ?Patients with Stable Coronary Artery Disease: Recommendations on ?Interpretation and Reporting. Radiology: Cardiothoracic Imaging. ?2019;1(5):e190050 ?  ?FINDINGS: ?1. Left Main: 0.94; low likelihood of hemodynamic significance. ?  ?2. Prox LAD: 0.86; low likelihood of hemodynamic significance. ?3. Mid LAD: 0.83; low likelihood of hemodynamic significance. ?4. Prox LCX: 0.87; low likelihood of hemodynamic significance. ?5. Distal LCX: 0.80; low likelihood of hemodynamic significance. ?6. RCA: 0.88; low likelihood of hemodynamic significance. ?  ?IMPRESSION: ?  ?1. CT FFR negative. CCTA concerning for left main disease >50%. ?Would recommend cardiac cath +/- IVUS for clarification. ?   ?07-18-2000, MD ? ?Echo 01/01/22 ?IMPRESSIONS  ? ? ? 1. Left ventricular ejection fraction, by estimation, is 60 to 65%. The  ?left ventricle has normal function. The left ventricle has no regional  ?wall motion abnormalities. There is mild concentric left ventricular  ?hypertrophy. Left ventricular diastolic  ?parameters are consistent with Grade I diastolic dysfunction (impaired  ?relaxation).  ? 2. Right ventricular systolic function is normal. The right ventricular  ?size is mildly enlarged. There is normal pulmonary artery systolic  ?pressure. The estimated right ventricular systolic pressure is 14.4 mmHg.  ? 3. Left atrial size was mildly dilated.  ? 4. The mitral valve is normal in structure. Trivial mitral valve  ?regurgitation.  ? 5. The aortic valve is  tricuspid. There is mild calcification of the  ?aortic valve. There is moderate thickening of the aortic valve. Aortic  ?valve regurgitation is not visualized. Aortic valve  ?sclerosis/calcification is present, without any evidence of  ?aortic stenosis.  ? 6. Aortic dilatation noted. There is borderline dilatation of the  ?ascending aorta, measuring 38 mm.  ? 7. The inferior vena cava is normal in size with greater than 50%  ?respiratory variability, suggesting right atrial pressure of 3 mmHg.  ? ?Comparison(s): No prior Echocardiogram.  ? ?Risk Assessment/Calculations:   ?  ? ? ? ? ?ASSESSMENT:   ? ?1. Coronary artery disease involving native coronary artery of native heart without angina pectoris   ?2. Essential hypertension   ?3. Hyperlipidemia, unspecified hyperlipidemia type   ?4. Type 2 diabetes mellitus with hyperlipidemia (HCC)   ?5. Peripheral arterial disease (HCC)   ?6. Vascular dementia without behavioral disturbance, psychotic disturbance, mood disturbance, or anxiety, unspecified dementia severity (HCC)   ? ? ? ?PLAN:  ?In order of problems listed above: ? ?CAD CCTA suggestive of greater than 50% left main, FFR negative, patient declined  cardiac cath and wants medical therapy. Has some constant chest pressure sore to touch that is more M-S. Will give NTG SL prn for exertional chest pressure. ? ?Hypertension-BP now well controlled, 2 gm sodium diet

## 2022-01-24 ENCOUNTER — Encounter: Payer: Self-pay | Admitting: Physician Assistant

## 2022-01-24 ENCOUNTER — Ambulatory Visit: Payer: Medicare HMO | Admitting: Physician Assistant

## 2022-01-24 VITALS — BP 136/80 | HR 66 | Ht 63.0 in | Wt 185.0 lb

## 2022-01-24 DIAGNOSIS — E785 Hyperlipidemia, unspecified: Secondary | ICD-10-CM | POA: Diagnosis not present

## 2022-01-24 DIAGNOSIS — E1169 Type 2 diabetes mellitus with other specified complication: Secondary | ICD-10-CM | POA: Diagnosis not present

## 2022-01-24 DIAGNOSIS — I2510 Atherosclerotic heart disease of native coronary artery without angina pectoris: Secondary | ICD-10-CM

## 2022-01-24 DIAGNOSIS — I10 Essential (primary) hypertension: Secondary | ICD-10-CM | POA: Diagnosis not present

## 2022-01-24 DIAGNOSIS — I739 Peripheral vascular disease, unspecified: Secondary | ICD-10-CM

## 2022-01-24 DIAGNOSIS — F0150 Vascular dementia without behavioral disturbance: Secondary | ICD-10-CM

## 2022-01-24 MED ORDER — NITROGLYCERIN 0.4 MG SL SUBL: 0.4000 mg | SUBLINGUAL_TABLET | SUBLINGUAL | 3 refills | Status: AC | PRN

## 2022-01-24 NOTE — Patient Instructions (Signed)
Medication Instructions:  ?Your physician has recommended you make the following change in your medication:  ? ?Nitroglycerin 0.4mg  tablet as needed ? ?*If you need a refill on your cardiac medications before your next appointment, please call your pharmacy* ? ? ?Lab Work: ?None ?If you have labs (blood work) drawn today and your tests are completely normal, you will receive your results only by: ?MyChart Message (if you have MyChart) OR ?A paper copy in the mail ?If you have any lab test that is abnormal or we need to change your treatment, we will call you to review the results. ? ? ?Follow-Up: ?At [facility], you and your health needs are our priority.  As part of our continuing mission to provide you with exceptional heart care, we have created designated Provider Care Teams.  These Care Teams include your primary Cardiologist (physician) and Advanced Practice Providers (APPs -  Physician Assistants and Nurse Practitioners) who all work together to provide you with the care you need, when you need it. ? ?We recommend signing up for the patient portal called "MyChart".  Sign up information is provided on this After Visit Summary.  MyChart is used to connect with patients for Virtual Visits (Telemedicine).  Patients are able to view lab/test results, encounter notes, upcoming appointments, etc.  Non-urgent messages can be sent to your provider as well.   ?To learn more about what you can do with MyChart, go to [email or web address].   ? ?Your next appointment:   ?3-4 month(s) ? ?The format for your next appointment:   ?In Person ? ?Provider:   ?Dietrich Pates, MD { ? ? ?Other Instructions ?We recommend you wear Compression Hose ? ?Two Gram Sodium Diet 2000 mg ? ?What is Sodium? Sodium is a mineral found naturally in many foods. The most significant source of sodium in the diet is table salt, which is about 40% sodium.  Processed, convenience, and preserved foods also contain a large amount of sodium.  The body  needs only 500 mg of sodium daily to function,  A normal diet provides more than enough sodium even if you do not use salt. ? ?Why Limit Sodium? A build up of sodium in the body can cause thirst, increased blood pressure, shortness of breath, and water retention.  Decreasing sodium in the diet can reduce edema and risk of heart attack or stroke associated with high blood pressure.  Keep in mind that there are many other factors involved in these health problems.  Heredity, obesity, lack of exercise, cigarette smoking, stress and what you eat all play a role. ? ?General Guidelines: ?Do not add salt at the table or in cooking.  One teaspoon of salt contains over 2 grams of sodium. ?Read food labels ?Avoid processed and convenience foods ?Ask your dietitian before eating any foods not dicussed in the menu planning guidelines ?Consult your physician if you wish to use a salt substitute or a sodium containing medication such as antacids.  Limit milk and milk products to 16 oz (2 cups) per day. ? ?Shopping Hints: ?READ LABELS!! "Dietetic" does not necessarily mean low sodium. ?Salt and other sodium ingredients are often added to foods during processing. ? ? ? ?Menu Planning Guidelines ?Food Group Choose More Often Avoid  ?Beverages (see also the milk group All fruit juices, low-sodium, salt-free vegetables juices, low-sodium carbonated beverages Regular vegetable or tomato juices, commercially softened water used for drinking or cooking  ?Breads and Cereals Enriched white, wheat, rye and pumpernickel bread, hard rolls  and dinner rolls; muffins, cornbread and waffles; most dry cereals, cooked cereal without added salt; unsalted crackers and breadsticks; low sodium or homemade bread crumbs Bread, rolls and crackers with salted tops; quick breads; instant hot cereals; pancakes; commercial bread stuffing; self-rising flower and biscuit mixes; regular bread crumbs or cracker crumbs  ?Desserts and Sweets Desserts and sweets mad  with mild should be within allowance Instant pudding mixes and cake mixes  ?Fats Butter or margarine; vegetable oils; unsalted salad dressings, regular salad dressings limited to 1 Tbs; light, sour and heavy cream Regular salad dressings containing bacon fat, bacon bits, and salt pork; snack dips made with instant soup mixes or processed cheese; salted nuts  ?Fruits Most fresh, frozen and canned fruits Fruits processed with salt or sodium-containing ingredient (some dried fruits are processed with sodium sulfites  ? ? ? ? ? ? ?Vegetables Fresh, frozen vegetables and low- sodium canned vegetables Regular canned vegetables, sauerkraut, pickled vegetables, and others prepared in brine; frozen vegetables in sauces; vegetables seasoned with ham, bacon or salt pork  ?Condiments, Sauces, Miscellaneous ? Salt substitute with physician's approval; pepper, herbs, spices; vinegar, lemon or lime juice; hot pepper sauce; garlic powder, onion powder, low sodium soy sauce (1 Tbs.); low sodium condiments (ketchup, chili sauce, mustard) in limited amounts (1 tsp.) fresh ground horseradish; unsalted tortilla chips, pretzels, potato chips, popcorn, salsa (1/4 cup) Any seasoning made with salt including garlic salt, celery salt, onion salt, and seasoned salt; sea salt, rock salt, kosher salt; meat tenderizers; monosodium glutamate; mustard, regular soy sauce, barbecue, sauce, chili sauce, teriyaki sauce, steak sauce, Worcestershire sauce, and most flavored vinegars; canned gravy and mixes; regular condiments; salted snack foods, olives, picles, relish, horseradish sauce, catsup  ? ?Food preparation: Try these seasonings ?Meats:    ?Pork Sage, onion Serve with applesauce  ?Chicken Poultry seasoning, thyme, parsley Serve with cranberry sauce  ?Lamb Curry powder, rosemary, garlic, thyme Serve with mint sauce or jelly  ?Veal Marjoram, basil Serve with current jelly, cranberry sauce  ?Beef Pepper, bay leaf Serve with dry mustard, unsalted  chive butter  ?[place] leaf, dill Serve with unsalted lemon butter, unsalted parsley butter  ?Vegetables:    ?Asparagus Lemon juice   ?Broccoli Lemon juice   ?Carrots Mustard dressing parsley, mint, nutmeg, glazed with unsalted butter and sugar   ?Green beans Marjoram, lemon juice, nutmeg,dill seed   ?Tomatoes Basil, marjoram, onion   ?Spice /blend for "Salt Shaker" 4 tsp ground thyme ?1 tsp ground sage 3 tsp ground rosemary ?4 tsp ground marjoram  ? ?Test your knowledge ?A product that says "Salt Free" may still contain sodium. True or False ?Garlic Powder and Hot Pepper Sauce an be used as alternative seasonings.True or False ?Processed foods have more sodium than fresh foods.  True or False ?Canned Vegetables have less sodium than froze True or False ? ? ?WAYS TO DECREASE YOUR SODIUM INTAKE ?Avoid the use of added salt in cooking and at the table.  Table salt (and other prepared seasonings which contain salt) is probably one of the greatest sources of sodium in the diet.  Unsalted foods can gain flavor from the sweet, sour, and butter taste sensations of herbs and spices.  Instead of using salt for seasoning, try the following seasonings with the foods listed.  Remember: how you use them to enhance natural food flavors is limited only by your creativity... ?Allspice-Meat, fish, eggs, fruit, peas, red and yellow vegetables ?Almond Extract-Fruit baked goods ?Anise Seed-Sweet breads, fruit, carrots, beets,  cottage cheese, cookies (tastes like licorice) ?Basil-Meat, fish, eggs, vegetables, rice, vegetables salads, soups, sauces ?Bay Leaf-Meat, fish, stews, poultry ?Burnet-Salad, vegetables (cucumber-like flavor) ?Caraway Seed-Bread, cookies, cottage cheese, meat, vegetables, cheese, rice ?Cardamon-Baked goods, fruit, soups ?Celery Powder or seed-Salads, salad dressings, sauces, meatloaf, soup, bread.Do not use  celery salt ?Chervil-Meats, salads, fish, eggs, vegetables, cottage cheese (parsley-like flavor) ?Chili  Power-Meatloaf, chicken cheese, corn, eggplant, egg dishes ?Chives-Salads cottage cheese, egg dishes, soups, vegetables, sauces ?Cilantro-Salsa, casseroles ?Cinnamon-Baked goods, fruit, pork, lamb, chick

## 2022-01-26 ENCOUNTER — Ambulatory Visit: Payer: Medicare HMO | Admitting: Physical Therapy

## 2022-05-30 ENCOUNTER — Ambulatory Visit: Payer: Medicare HMO | Admitting: Internal Medicine

## 2022-06-05 ENCOUNTER — Ambulatory Visit (INDEPENDENT_AMBULATORY_CARE_PROVIDER_SITE_OTHER): Payer: Medicare HMO | Admitting: Internal Medicine

## 2022-06-05 ENCOUNTER — Encounter: Payer: Self-pay | Admitting: Internal Medicine

## 2022-06-05 VITALS — BP 116/68 | HR 76 | Ht 64.0 in | Wt 177.0 lb

## 2022-06-05 DIAGNOSIS — I2510 Atherosclerotic heart disease of native coronary artery without angina pectoris: Secondary | ICD-10-CM

## 2022-06-05 DIAGNOSIS — E785 Hyperlipidemia, unspecified: Secondary | ICD-10-CM | POA: Diagnosis not present

## 2022-06-05 NOTE — Progress Notes (Signed)
Cardiology Office Note   Date:  06/05/2022   ID:  Jasmine Crawford, DOB Jan 30, 1941, MRN [id number]  PCP:  Durward Parcel, MD  Cardiologist:   Dorris Carnes, MD   Pt presents for follow up of HTN   History of Present Illness: Jasmine Crawford is a 81 y.o. female with a history of HTN, HLD, DM, PAD, and dementia,  She presented to [facility] with CP and SOB  in April 2023   BP extremely elevated at time .  Troponins negative x4 EKG with left bundle branch block unknown chronicity.  2D echo EF 60 to 65% no wall motion abnormality.  CT coronary study in April sugg that LM was severely narrowed.   Even though FR negative, recomm for consideration of invasive eval    After discussion with the patient her wish is for conservative management without cardiac cath.  Blood pressure improved with amlodipine and atenolol and HCTZ.   She was last seen in clinic by Gerrianne Scale in May 2023    She denies CP   Breathing is relatively stable   No dizziness   No palpitations      Current Meds  Medication Sig   albuterol (VENTOLIN HFA) 108 (90 Base) MCG/ACT inhaler Inhale 1-2 puffs into the lungs every 6 (six) hours as needed for wheezing or shortness of breath.   amLODipine (NORVASC) 10 MG tablet Take 1 tablet by mouth daily.   ammonium lactate (LAC-HYDRIN) 12 % lotion Apply 1 application. topically as needed for dry skin.   aspirin EC 81 MG tablet Take 81 mg by mouth daily. Swallow whole.   atenolol (TENORMIN) 25 MG tablet Take 75 mg by mouth at bedtime.   donepezil (ARICEPT) 10 MG tablet Take by mouth.   fluticasone (FLONASE) 50 MCG/ACT nasal spray Place 1 spray into both nostrils 2 (two) times daily as needed for allergies or rhinitis.   hydrochlorothiazide (HYDRODIURIL) 25 MG tablet Take 25 mg by mouth at bedtime.   metFORMIN (GLUCOPHAGE-XR) 500 MG 24 hr tablet Take 500 mg by mouth in the morning and at bedtime.   naproxen (NAPROSYN) 500 MG tablet Take 500 mg by mouth 2 (two) times daily as needed for mild pain.    nitroGLYCERIN (NITROSTAT) 0.4 MG SL tablet Place 1 tablet (0.4 mg total) under the tongue every 5 (five) minutes as needed for chest pain.   rosuvastatin (CRESTOR) 20 MG tablet Take 1 tablet (20 mg total) by mouth daily.     Allergies:   Bacitracin, Erythromycin base, Losartan, and Ramipril   Past Medical History:  Diagnosis Date   Arthritis    Blind left eye    Diabetes ([place])    Hyperlipidemia    Hypertension    Hypothyroid    PAD (peripheral artery disease) ([place])     Past Surgical History:  Procedure Laterality Date   EYE SURGERY Left    had glaucoma     Social History:  The patient  reports that she has never smoked. She has never used smokeless tobacco. She reports that she does not drink alcohol and does not use drugs.   Family History:  The patient's family history is not on file.    ROS:  Please see the history of present illness. All other systems are reviewed and  Negative to the above problem except as noted.    PHYSICAL EXAM: VS:  BP 116/68   Pulse 76   Ht 5\' 4"  (1.626 m)   Wt 177 lb (80.3  kg)   SpO2 96%   BMI 30.38 kg/m   GEN: Well nourished, well developed, in no acute distress  HEENT: normal  Neck: no JVD, carotid bruits Cardiac: RRR; with occasional skip    no murmurs  Tr LE edema  Respiratory:  clear to auscultation bilaterally,  GI: soft, nontender, nondistended, + BS  No hepatomegaly  MS: no deformity Moving all extremities   Skin: warm and dry, no rash Neuro:  Strength and sensation are intact Psych: euthymic mood, full affect   EKG:  EKG is ordered today.  NSR with PACs, PVCs   70 bpm   Septal MI   LAFB  Echo 12/1021 Left ventricular ejection fraction, by estimation, is 60 to 65%. The left ventricle has normal function. The left ventricle has no regional wall motion abnormalities. There is mild concentric left ventricular hypertrophy. Left ventricular diastolic parameters are consistent with Grade I diastolic dysfunction (impaired  relaxation). 1. Right ventricular systolic function is normal. The right ventricular size is mildly enlarged. There is normal pulmonary artery systolic pressure. The estimated right ventricular systolic pressure is 14.4 mmHg. 2. 3. Left atrial size was mildly dilated. 4. The mitral valve is normal in structure. Trivial mitral valve regurgitation. The aortic valve is tricuspid. There is mild calcification of the aortic valve. There is moderate thickening of the aortic valve. Aortic valve regurgitation is not visualized. Aortic valve sclerosis/calcification is present, without any evidence of aortic stenosis. 5. Aortic dilatation noted. There is borderline dilatation of the ascending aorta, measuring 38 mm. 6. The inferior vena cava is normal in size with greater than 50% respiratory variability, suggesting right atrial pressure of 3 mmHg. 7. Comparison(s): No prior Echocardiogram.  CT coronary angiogram  01/01/22  Left main: The left main is a large caliber vessel with a normal take off from the left coronary cusp that bifurcates to form a left anterior descending artery and a left circumflex artery. There is concern for moderate non-calcified plaque (50-69%).   Left anterior descending artery: The proximal LAD contains mild mixed density plaque (25-49%). The mid LAD contains mild non-calcified plaque (25-49%). The distal LAD is patent.   The LAD is patent without evidence of plaque or stenosis. The LAD gives off 1 patent diagonal branch.   Left circumflex artery: The LCX is non-dominant. The proximal LCX contains moderate mixed density plaque (50-69%). The distal LCX contains moderate mixed density plaque (50-69%). OM1 contains mild non-calcified plaque (25-49%). OM2 and OM3 are patent.   Right coronary artery: The RCA is dominant with normal take off from the right coronary cusp. The proximal RCA contains mild mixed density plaque (25-49%). The mid and distal segments  contains minimal CAD (<25%). The RCA terminates as a PDA and right posterolateral branch. The PDA is patent. The PLV contains mild non-calcified plaque (25-49%).   Right Atrium: Right atrial size is within normal limits.   Right Ventricle: The right ventricular cavity is within normal limits.   Left Atrium: Left atrial size is normal in size with no left atrial appendage filling defect.   Left Ventricle: The ventricular cavity size is within normal limits.   Pulmonary arteries: Normal in size without proximal filling defect.   Pulmonary veins: Normal pulmonary venous drainage.   Pericardium: Normal thickness without significant effusion or calcium present.   Cardiac valves: The aortic valve is trileaflet without significant calcification. The mitral valve is normal without significant calcification.   Aorta: Normal caliber without significant disease.   Extra-cardiac findings: See  attached radiology report for non-cardiac structures.   IMPRESSION: 1. Coronary calcium score of 248. This was 63rd percentile for age-, sex, and race-matched controls.   2. Normal coronary origin with right dominance.   3. 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   4. Moderate LCX stenosis (50-69%).   5. Mild CAD (25-49%) in the LAD/RCA.   RECOMMENDATIONS: 1. Severe stenosis. (70-99% or > 50% left main). Cardiac catheterization is recommended. CT FFR will be submitted. Consider symptom-guided anti-ischemic pharmacotherapy as well as risk factor modification per guideline directed care. Invasive coronary angiography recommended with revascularization per published guideline statements.   FFR  01/01/22  1. Left Main: 0.94; low likelihood of hemodynamic significance.   2. Prox LAD: 0.86; low likelihood of hemodynamic significance. 3. Mid LAD: 0.83; low likelihood of hemodynamic significance. 4. Prox LCX: 0.87; low likelihood of hemodynamic significance. 5. Distal LCX: 0.80; low likelihood  of hemodynamic significance. 6. RCA: 0.88; low likelihood of hemodynamic significance.   IMPRESSION:   1. CT FFR negative. CCTA concerning for left main disease >50%. Would recommend cardiac cath +/- IVUS for clarification.    Lipid Panel    Component Value Date/Time   CHOL 162 01/01/2022 0113   TRIG 122 01/01/2022 0113   HDL 53 01/01/2022 0113   CHOLHDL 3.1 01/01/2022 0113   VLDL 24 01/01/2022 0113   LDLCALC 85 01/01/2022 0113      Wt Readings from Last 3 Encounters:  06/05/22 177 lb (80.3 kg)  01/24/22 185 lb (83.9 kg)  01/01/22 185 lb 14.4 oz (84.3 kg)      ASSESSMENT AND PLAN:  1 CAD   Pt with  known CAD on CT scan   No symptoms to sugg angina   Follow   2  HTN  BP is controlled      3  HL   Will check lipomed     Last LDL in APril was 77          Current medicines are reviewed at length with the patient today.  The patient does not have concerns regarding medicines.  Signed, Dietrich Pates, MD  06/05/2022 11:48 AM    [facility] [address] [place], [place], [place]  [address] Phone: [phone]; Fax: [phone]

## 2022-06-05 NOTE — Patient Instructions (Signed)
Medication Instructions:   *If you need a refill on your cardiac medications before your next appointment, please call your pharmacy*   Lab Work: NMR   If you have labs (blood work) drawn today and your tests are completely normal, you will receive your results only by: MyChart Message (if you have MyChart) OR A paper copy in the mail If you have any lab test that is abnormal or we need to change your treatment, we will call you to review the results.   Testing/Procedures:    Follow-Up: At [facility] you and your health needs are our priority.  As part of our continuing mission to provide you with exceptional heart care, we have created designated Provider Care Teams.  These Care Teams include your primary Cardiologist (physician) and Advanced Practice Providers (APPs -  Physician Assistants and Nurse Practitioners) who all work together to provide you with the care you need, when you need it.  We recommend signing up for the patient portal called "MyChart".  Sign up information is provided on this After Visit Summary.  MyChart is used to connect with patients for Virtual Visits (Telemedicine).  Patients are able to view lab/test results, encounter notes, upcoming appointments, etc.  Non-urgent messages can be sent to your provider as well.   To learn more about what you can do with MyChart, go to [email or web address].    Your next appointment:   6 month(s)  The format for your next appointment:   In Person  Provider:   Dietrich Pates, MD     Other Instructions   Important Information About Sugar

## 2022-06-06 LAB — NMR, LIPOPROFILE
Cholesterol, Total: 123 mg/dL (ref 100–199)
HDL Particle Number: 31.2 umol/L (ref >=30.5)
HDL-C: 59 mg/dL (ref >39)
LDL Particle Number: 422 nmol/L (ref <1000)
LDL Size: 20.5 nm — ABNORMAL LOW (ref >20.5)
LDL-C (NIH Calc): 36 mg/dL (ref 0–99)
LP-IR Score: 40 (ref <=45)
Small LDL Particle Number: 169 nmol/L (ref <=527)
Triglycerides: 170 mg/dL — ABNORMAL HIGH (ref 0–149)

## 2022-08-28 ENCOUNTER — Ambulatory Visit: Payer: Medicare HMO | Admitting: Internal Medicine

## 2023-01-09 ENCOUNTER — Telehealth: Payer: Self-pay | Admitting: Internal Medicine

## 2023-01-09 NOTE — Telephone Encounter (Signed)
STAT if HR is under 50 or over 120 (normal HR is 60-100 beats per minute)  What is your heart rate? 48 for 2 mins and then went to 51 and 54   Do you have a log of your heart rate readings (document readings)?   Do you have any other symptoms? Dizziness. Home Health Care nurse states that she has fallen twice in the last 24 hrs. Also has strong smelling urine, so they are testing her for UTI.

## 2023-01-09 NOTE — Telephone Encounter (Signed)
Home Health nurse says she is seeing the pt todat and the pt is having UTI symptoms and foul smelling urine so she is getting a UA per her PCP.Marland Kitchen She wanted to reach out to Dr Tenny Craw that her Pulse on pulse OX has been reading 48 on two separate visits.Marland Kitchen after some exertional exercise her HR goes up to the 50's.   She is only some intermittent dizziness but otherwise mostly UTI issues at this time.   She is asking if the pt can have a monitor that she can help put on her.   The ot is also due for follow up so the daughter will call and make her an OV.   Will forward to Dr Tenny Craw to review and see if she would like to order the monitor prior to seeing her back in the office. Marland Kitchen

## 2023-01-23 ENCOUNTER — Telehealth: Payer: Self-pay | Admitting: Internal Medicine

## 2023-01-23 DIAGNOSIS — R55 Syncope and collapse: Secondary | ICD-10-CM

## 2023-01-23 DIAGNOSIS — I10 Essential (primary) hypertension: Secondary | ICD-10-CM

## 2023-01-23 NOTE — Telephone Encounter (Signed)
STAT if HR is under 50 or over 120 (normal HR is 60-100 beats per minute)  What is your heart rate?   Do you have a log of your heart rate readings (document readings)?  Patient's daughter states physical therapy reports low HR over the past 1-2 weeks. It has been ranging 46-53. Remaining in the upper 40's more recently.   Do you have any other symptoms?  Dizziness and low HR after a fall

## 2023-01-23 NOTE — Telephone Encounter (Signed)
Spoke to the pt daughter Jasmine Crawford ( on [place]) pt and daughter reside in the same household. Pt fell on 4/15 and 4/16, PCP was notified. Jasmine Crawford mentioned she does monitor pt HR daily, she did not have the recordings at this time. Pt daughter stated her mothers HR is ranging between  the 45's and 50's. There is a phone note from 4/17:  Home Health nurse says she is seeing the pt todat and the pt is having UTI symptoms and foul smelling urine so she is getting a UA per her PCP.Marland Kitchen She wanted to reach out to Dr Tenny Craw that her Pulse on pulse OX has been reading 48 on two separate visits.Marland Kitchen after some exertional exercise her HR goes up to the 50's.    She is only some intermittent dizziness but otherwise mostly UTI issues at this time.    She is asking if the pt can have a monitor that she can help put on her.    The ot is also due for follow up so the daughter will call and make her an OV.    Will forward to Dr Tenny Craw to review and see if she would like to order the monitor prior to seeing her back in the office. .    Pt daughter mentioned she did take pt to PCP attempt to get a UA however, patient was not able to give urine sample while in the office. PCP office mentioned to the daughter, there is an order placed for the patient to give a UA at any [facility]. Pt daughter did take her mother to a [facility] near her and there was not an order found. Advised Jasmine Crawford to contact PCP and explain the situation. PCP lowered Atenolol 10 mg to 5 mg , pt HR is still measuring between 40's and 50's. Jasmine Crawford would like to know if pt can have an order for a heart monitor and possible parameters while taking atenolol. Advised pt daughter MD and nurse are currently not in the office. Will forward to MD and nurse.

## 2023-01-23 NOTE — Telephone Encounter (Signed)
Please set patient up for 72 hour monitor (Zio) Daughter should keep taking BP

## 2023-01-28 ENCOUNTER — Ambulatory Visit: Payer: Medicare HMO | Attending: Internal Medicine

## 2023-01-28 DIAGNOSIS — R55 Syncope and collapse: Secondary | ICD-10-CM

## 2023-01-28 DIAGNOSIS — I10 Essential (primary) hypertension: Secondary | ICD-10-CM

## 2023-01-28 NOTE — Progress Notes (Unsigned)
Enrolled for [employer] to mail a ZIO XT long term holter monitor to the patients address on file.  

## 2023-01-28 NOTE — Telephone Encounter (Signed)
Spoke with one daughter who is not with meds or mother  Can cut amlodipine in 1/2 to 5 mg    Atenolol  Cut in 1/2 if still taking    Need to call in am to clarify dosing   Keep following BP

## 2023-01-28 NOTE — Telephone Encounter (Signed)
Spoke with the patient's daughter and advised on recommendations from Dr. Tenny Craw. Daughter states that this past Friday night they were eating dinner around 8:30pm and the patient passed out. She fell to the floor and came too very quickly after but was a bit disoriented and lethargic. The daughter has taken the patient's BP right before dinner and it was 80/60, HR 53. The daughter called EMS who came out and evaluated the patient. Patient's BP eventually  normalized. They did an EKG which was normal. The daughter reports that she has continued to monitor the patient's BP and it has been 120/89, 78/69, 80/74, 102/69. I went ahead and ordered a heart monitor and encouraged daughter to make sure the patient is staying well hydrated, changing positions slowly. Aware of ER precautions for future episodes. Will forward to Dr. Tenny Craw for further recommendations. Tried to schedule patient an appointment but do not see any openings this week.

## 2023-01-29 MED ORDER — AMLODIPINE BESYLATE 2.5 MG PO TABS: 2.5000 mg | ORAL_TABLET | Freq: Every day | ORAL | 3 refills | Status: AC

## 2023-01-29 MED ORDER — ATENOLOL 25 MG PO TABS: 12.5000 mg | ORAL_TABLET | Freq: Every day | ORAL | 3 refills | Status: AC

## 2023-01-29 NOTE — Telephone Encounter (Signed)
Spoke with the patient's daughter who states that the patient is currently taking amlodipine 5 mg daily and atenolol 25 mg daily. She will cut both in 1/2 to amlodipine 2.5 mg and atenolol to 12.5 mg. She will call back if patient is still symptomatic.

## 2023-01-29 NOTE — Telephone Encounter (Signed)
Attempted to call patient's daughter. Unable to leave voicemail.  

## 2023-01-29 NOTE — Telephone Encounter (Signed)
Patient's daughter Tad Moore returned call.

## 2023-03-07 ENCOUNTER — Inpatient Hospital Stay (HOSPITAL_COMMUNITY)
Admission: EM | Admit: 2023-03-07 | Discharge: 2023-03-13 | DRG: 244 | Disposition: A | Discharge status: Skilled Nursing Facility | Payer: Medicare HMO | Attending: Internal Medicine | Admitting: Internal Medicine

## 2023-03-07 ENCOUNTER — Emergency Department (HOSPITAL_COMMUNITY): Payer: Medicare HMO

## 2023-03-07 ENCOUNTER — Other Ambulatory Visit: Payer: Self-pay

## 2023-03-07 DIAGNOSIS — I442 Atrioventricular block, complete: Principal | ICD-10-CM

## 2023-03-07 DIAGNOSIS — Z881 Allergy status to other antibiotic agents status: Secondary | ICD-10-CM

## 2023-03-07 DIAGNOSIS — R55 Syncope and collapse: Principal | ICD-10-CM

## 2023-03-07 DIAGNOSIS — E039 Hypothyroidism, unspecified: Secondary | ICD-10-CM | POA: Diagnosis present

## 2023-03-07 DIAGNOSIS — Z79899 Other long term (current) drug therapy: Secondary | ICD-10-CM

## 2023-03-07 DIAGNOSIS — Z7982 Long term (current) use of aspirin: Secondary | ICD-10-CM

## 2023-03-07 DIAGNOSIS — Z66 Do not resuscitate: Secondary | ICD-10-CM | POA: Diagnosis present

## 2023-03-07 DIAGNOSIS — E1151 Type 2 diabetes mellitus with diabetic peripheral angiopathy without gangrene: Secondary | ICD-10-CM | POA: Diagnosis present

## 2023-03-07 DIAGNOSIS — Z888 Allergy status to other drugs, medicaments and biological substances status: Secondary | ICD-10-CM

## 2023-03-07 DIAGNOSIS — Z5941 Food insecurity: Secondary | ICD-10-CM

## 2023-03-07 DIAGNOSIS — I10 Essential (primary) hypertension: Secondary | ICD-10-CM | POA: Diagnosis present

## 2023-03-07 DIAGNOSIS — I2510 Atherosclerotic heart disease of native coronary artery without angina pectoris: Secondary | ICD-10-CM | POA: Diagnosis present

## 2023-03-07 DIAGNOSIS — E785 Hyperlipidemia, unspecified: Secondary | ICD-10-CM | POA: Diagnosis present

## 2023-03-07 DIAGNOSIS — Z7984 Long term (current) use of oral hypoglycemic drugs: Secondary | ICD-10-CM

## 2023-03-07 DIAGNOSIS — F03A0 Unspecified dementia, mild, without behavioral disturbance, psychotic disturbance, mood disturbance, and anxiety: Secondary | ICD-10-CM | POA: Diagnosis present

## 2023-03-07 DIAGNOSIS — H5462 Unqualified visual loss, left eye, normal vision right eye: Secondary | ICD-10-CM | POA: Diagnosis present

## 2023-03-07 LAB — COMPREHENSIVE METABOLIC PANEL
ALT: 18 U/L (ref 0–44)
AST: 25 U/L (ref 15–41)
Albumin: 3.8 g/dL (ref 3.5–5.0)
Alkaline Phosphatase: 48 U/L (ref 38–126)
Anion gap: 11 (ref 5–15)
BUN: 12 mg/dL (ref 8–23)
CO2: 26 mmol/L (ref 22–32)
Calcium: 10.3 mg/dL (ref 8.9–10.3)
Chloride: 100 mmol/L (ref 98–111)
Creatinine, Ser: 1.06 mg/dL — ABNORMAL HIGH (ref 0.44–1.00)
GFR, Estimated: 52 mL/min — ABNORMAL LOW (ref >60)
Glucose, Bld: 204 mg/dL — ABNORMAL HIGH (ref 70–99)
Potassium: 3.7 mmol/L (ref 3.5–5.1)
Sodium: 137 mmol/L (ref 135–145)
Total Bilirubin: 1.9 mg/dL — ABNORMAL HIGH (ref 0.3–1.2)
Total Protein: 7.3 g/dL (ref 6.5–8.1)

## 2023-03-07 LAB — CBC WITH DIFFERENTIAL/PLATELET
Abs Immature Granulocytes: 0.04 10*3/uL (ref 0.00–0.07)
Basophils Absolute: 0.1 10*3/uL (ref 0.0–0.1)
Basophils Relative: 1 %
Eosinophils Absolute: 0.1 10*3/uL (ref 0.0–0.5)
Eosinophils Relative: 1 %
HCT: 49.4 % — ABNORMAL HIGH (ref 36.0–46.0)
Hemoglobin: 16.9 g/dL — ABNORMAL HIGH (ref 12.0–15.0)
Immature Granulocytes: 0 %
Lymphocytes Relative: 25 %
Lymphs Abs: 2.2 10*3/uL (ref 0.7–4.0)
MCH: 32 pg (ref 26.0–34.0)
MCHC: 34.2 g/dL (ref 30.0–36.0)
MCV: 93.6 fL (ref 80.0–100.0)
Monocytes Absolute: 0.6 10*3/uL (ref 0.1–1.0)
Monocytes Relative: 6 %
Neutro Abs: 6.1 10*3/uL (ref 1.7–7.7)
Neutrophils Relative %: 67 %
Platelets: 198 10*3/uL (ref 150–400)
RBC: 5.28 MIL/uL — ABNORMAL HIGH (ref 3.87–5.11)
RDW: 14.6 % (ref 11.5–15.5)
WBC: 9.2 10*3/uL (ref 4.0–10.5)
nRBC: 0 % (ref 0.0–0.2)

## 2023-03-07 LAB — TSH: TSH: 3.284 u[IU]/mL (ref 0.350–4.500)

## 2023-03-07 LAB — MAGNESIUM: Magnesium: 1.8 mg/dL (ref 1.7–2.4)

## 2023-03-07 LAB — URINALYSIS, ROUTINE W REFLEX MICROSCOPIC
Bilirubin Urine: NEGATIVE
Glucose, UA: NEGATIVE mg/dL
Hgb urine dipstick: NEGATIVE
Ketones, ur: NEGATIVE mg/dL
Leukocytes,Ua: NEGATIVE
Nitrite: NEGATIVE
Protein, ur: NEGATIVE mg/dL
Specific Gravity, Urine: 1.025 (ref 1.005–1.030)
pH: 6 (ref 5.0–8.0)

## 2023-03-07 MED ORDER — SODIUM CHLORIDE 0.9 % IV SOLN: 250.0000 mL | INTRAVENOUS | Status: DC | PRN

## 2023-03-07 MED ORDER — ACETAMINOPHEN 325 MG PO TABS
650.0000 mg | ORAL_TABLET | ORAL | Status: DC | PRN
  Administered 2023-03-09 – 2023-03-12 (×5): 650 mg via ORAL
  Filled 2023-03-07 (×5): qty 2

## 2023-03-07 MED ORDER — SODIUM CHLORIDE 0.9% FLUSH: 3.0000 mL | INTRAVENOUS | Status: DC | PRN

## 2023-03-07 MED ORDER — SODIUM CHLORIDE 0.9% FLUSH
3.0000 mL | Freq: Two times a day (BID) | INTRAVENOUS | Status: DC
  Administered 2023-03-08 – 2023-03-13 (×8): 3 mL via INTRAVENOUS

## 2023-03-07 MED ORDER — ONDANSETRON HCL 4 MG/2ML IJ SOLN: 4.0000 mg | Freq: Four times a day (QID) | INTRAMUSCULAR | Status: DC | PRN

## 2023-03-07 NOTE — ED Triage Notes (Signed)
Patient with hx of dementia arrives from home after family called EMS r/t an episode of unresponsiveness x a few mins. No fall or injury. 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 Rate at its lowest was 40.

## 2023-03-07 NOTE — ED Notes (Signed)
Pt attempted to urinate but unable to. Bladder scan 315. Primary RN made aware.

## 2023-03-07 NOTE — ED Provider Notes (Signed)
[place] EMERGENCY DEPARTMENT AT [facility] Provider Note   CSN: [id number] Arrival date & time: 03/07/23  1646     History  Chief Complaint  Patient presents with   Irregular Heart Beat    Jasmine Crawford is a 82 y.o. female.  Patient is an 82 year old female with a history of diabetes, hypertension, hyperlipidemia, PAD, hypothyroidism, mild dementia who is presenting today after a syncopal event at home.  EMS gives the story as the patient reports she cannot remember what happens but right now she feels okay.  Daughter lives with the patient and reports that she had just finished lunch and she got up and started just standing and staring.  She asked the patient if she was going to get her walker and the patient slowly said yes and then she asked her if she would need some help and she slowly said yes and then she became unresponsive and daughter helped her to the floor.  The event occurred for approximately 3 to 4 minutes and patient then woke up.  She is denying any chest pain or shortness of breath.  When fire arrived they initially reported blood pressure was soft but EMS reports since they have been with her her blood pressure has been normal and she has had no complaints.  She has had a slow heart rate and there EKG showed complete heart block.  Looking through cardiology notes her daughter had called Dr. Charlott Rakes office in May due to a low heart rate and they had decreased her atenolol to half which is 12.5 and her amlodipine down to 2.5.  She did have a syncopal episode once before and she was supposed to have a Zio patch but it was sent to the wrong address so she was still waiting to wear that.  The history is provided by the EMS personnel, the patient and medical records.       Home Medications Prior to Admission medications   Medication Sig Start Date End Date Taking? Authorizing Provider  albuterol (VENTOLIN HFA) 108 (90 Base) MCG/ACT inhaler Inhale 1-2 puffs into the  lungs every 6 (six) hours as needed for wheezing or shortness of breath.   Yes [provider]  amLODipine (NORVASC) 2.5 MG tablet Take 1 tablet (2.5 mg total) by mouth daily. Patient taking differently: Take 2.5 mg by mouth at bedtime. 01/29/23 04/29/23 Yes Pricilla Riffle, MD  ammonium lactate (LAC-HYDRIN) 12 % lotion Apply 1 application. topically as needed for dry skin.   Yes [provider]  aspirin EC 81 MG tablet Take 81 mg by mouth in the morning. Swallow whole.   Yes [provider]  atenolol (TENORMIN) 25 MG tablet Take 0.5 tablets (12.5 mg total) by mouth daily. Patient taking differently: Take 12.5 mg by mouth at bedtime. 01/29/23  Yes Pricilla Riffle, MD  FLUoxetine (PROZAC) 20 MG capsule Take 20 mg by mouth in the morning.   Yes [provider]  fluticasone (FLONASE) 50 MCG/ACT nasal spray Place 1 spray into both nostrils 2 (two) times daily as needed for allergies or rhinitis.   Yes [provider]  hydrochlorothiazide (HYDRODIURIL) 25 MG tablet Take 25 mg by mouth at bedtime.   Yes [provider]  latanoprost (XALATAN) 0.005 % ophthalmic solution Place 1 drop into both eyes at bedtime.   Yes [provider]  memantine (NAMENDA) 10 MG tablet Take 10 mg by mouth in the morning.   Yes [provider]  metFORMIN (  GLUCOPHAGE-XR) 500 MG 24 hr tablet Take 500 mg by mouth daily with breakfast.   Yes [provider]  naproxen (NAPROSYN) 500 MG tablet Take 500 mg by mouth 2 (two) times daily as needed for mild pain.   Yes [provider]  nitroGLYCERIN (NITROSTAT) 0.4 MG SL tablet Place 1 tablet (0.4 mg total) under the tongue every 5 (five) minutes as needed for chest pain. 01/24/22 03/07/23 Yes Dyann Kief, PA-C  potassium chloride SA (KLOR-CON M) 20 MEQ tablet Take 20 mEq by mouth in the morning.   Yes [provider]  rosuvastatin (CRESTOR) 20 MG tablet Take 1 tablet (20 mg total) by mouth daily.  01/03/22 03/07/23 Yes Arrien, York Ram, MD  TYLENOL 8 HOUR ARTHRITIS PAIN 650 MG CR tablet Take 1,300 mg by mouth every 8 (eight) hours as needed for pain.   Yes [provider]      Allergies    Bacitracin, Erythromycin base, Losartan, and Ramipril    Review of Systems   Review of Systems  Physical Exam Updated Vital Signs BP (!) 152/66 (BP Location: Right Arm)   Pulse (!) 46   Temp 97.7 F (36.5 C) (Oral)   Resp 14   SpO2 100%  Physical Exam Vitals and nursing note reviewed.  Constitutional:      General: She is not in acute distress.    Appearance: She is well-developed.  HENT:     Head: Normocephalic and atraumatic.  Eyes:     Pupils: Pupils are equal, round, and reactive to light.  Cardiovascular:     Rate and Rhythm: Regular rhythm. Bradycardia present.     Pulses: Normal pulses.     Heart sounds: Normal heart sounds. No murmur heard.    No friction rub.  Pulmonary:     Effort: Pulmonary effort is normal.     Breath sounds: Normal breath sounds. No wheezing or rales.  Abdominal:     General: Bowel sounds are normal. There is no distension.     Palpations: Abdomen is soft.     Tenderness: There is no abdominal tenderness. There is no guarding or rebound.  Musculoskeletal:        General: No tenderness. Normal range of motion.     Comments: No edema  Skin:    General: Skin is warm and dry.     Findings: No rash.  Neurological:     Mental Status: She is alert. Mental status is at baseline.     Cranial Nerves: No cranial nerve deficit.     Sensory: No sensory deficit.     Motor: No weakness.     Comments: Oriented to self and place  Psychiatric:        Behavior: Behavior normal.     ED Results / Procedures / Treatments   Labs (all labs ordered are listed, but only abnormal results are displayed) Labs Reviewed  CBC WITH DIFFERENTIAL/PLATELET - Abnormal; Notable for the following components:      Result Value   RBC 5.28 (*)    Hemoglobin  16.9 (*)    HCT 49.4 (*)    All other components within normal limits  COMPREHENSIVE METABOLIC PANEL - Abnormal; Notable for the following components:   Glucose, Bld 204 (*)    Creatinine, Ser 1.06 (*)    Total Bilirubin 1.9 (*)    GFR, Estimated 52 (*)    All other components within normal limits  MAGNESIUM  TSH    EKG EKG Interpretation  Date/Time:  Thursday March 07 2023 16:49:19 EDT Ventricular Rate:  46 PR Interval:    QRS Duration: 154 QT Interval:  482 QTC Calculation: 422 R Axis:   -76 Text Interpretation: new 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 No edema. Normal cardiopericardial silhouette. Calcified aorta. Overlapping cardiac leads and defibrillator pads. The extreme lung apices are clipped off the edge of the film. IMPRESSION: No acute cardiopulmonary disease. Electronically Signed   By: Karen Kays M.D.   On: 03/07/2023 17:39    Procedures Procedures    Medications Ordered in ED Medications - No data to display  ED Course/ Medical Decision Making/ A&P                             Medical Decision Making Amount and/or Complexity of Data Reviewed Labs: ordered. Decision-making details documented in ED Course. Radiology: ordered and independent interpretation performed. Decision-making details documented in ED Course. ECG/medicine tests: ordered and independent interpretation performed. Decision-making details documented in ED Course.  Risk Decision regarding hospitalization.   Pt with multiple medical problems and comorbidities and presenting today with a complaint that caries a high risk for morbidity and mortality.  Here today after a syncopal event.  This is a second syncopal event  patient has had in a few months.  Seem like it occurred after she stood up based on EMS history.  Patient currently is asymptomatic but is noted to be bradycardic on exam.  Concern for dehydration, electrolyte abnormality, possible sepsis versus cardiac causes. I independently interpreted patient's EKG and there is concern for complete heart block.  This is most likely the cause of her syncope.  Patient is not febrile and is well-appearing and low suspicion today of sepsis.  Labs are pending but will also discuss with cardiology.  6:03 PM I independently interpreted his labs and CBC without acute findings, CMP with blood sugar of 204 but otherwise normal, magnesium and TSH are normal.  I have independently visualized and interpreted pt's images today.  Chest x-ray today without acute findings.  Spoke with Dr. Antoine Poche with cardiology who will come and evaluate the patient.  Feel that she requires admission for complete heart block and syncope.          Final Clinical Impression(s) / ED Diagnoses Final diagnoses:  Syncope, unspecified syncope type  Complete heart block [facility])    Rx / DC Orders ED Discharge Orders     None         Gwyneth Sprout, MD 03/07/23 1803

## 2023-03-07 NOTE — ED Notes (Signed)
ED TO INPATIENT HANDOFF REPORT  ED Nurse Name and Phone #: [phone] Jasmine Crawford  S Name/Age/Gender Jasmine Crawford 82 y.o. female Room/Bed: TRACC/TRACC  Code Status   Code Status: Full Code  Home/SNF/Other Home Patient oriented to: self, place, time, and situation Is this baseline? Yes   Triage Complete: Triage complete  Chief Complaint Complete heart block by electrocardiogram [facility]) [I44.2]  Triage Note Patient with hx of dementia arrives from home after family called EMS r/t an episode of unresponsiveness x a few mins. No fall or injury. 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 Rate at its lowest was 40.    Allergies Allergies  Allergen Reactions   Bacitracin Swelling and Other (See Comments)    "Swelling," but not swelling of the throat   Erythromycin Base Swelling and Other (See Comments)    "Swelling," but not swelling of the throat   Losartan Other (See Comments)    Reaction not recalled   Ramipril Other (See Comments)    Reaction not recalled     Level of Care/Admitting Diagnosis ED Disposition     ED Disposition  Admit   Condition  --   Comment  Hospital Area: [facility] [[id number]]  Level of Care: ICU [6]  May place patient in observation at [facility] or [facility] if equivalent level of care is available:: No  Covid Evaluation: Asymptomatic - no recent exposure (last 10 days) testing not required  Diagnosis: Complete heart block by electrocardiogram [facility]) [[id number]]  Admitting Physician: Pricilla Riffle [[id number]]  Attending Physician: Abbey Chatters          B Medical/Surgery History Past Medical History:  Diagnosis Date   Arthritis    Blind left eye    Diabetes (HCC)    Hyperlipidemia    Hypertension    Hypothyroid    PAD (peripheral artery disease) (HCC)    Past Surgical History:  Procedure Laterality Date   EYE SURGERY Left    had glaucoma     A IV Location/Drains/Wounds Patient Lines/Drains/Airways Status      Active Line/Drains/Airways     Name Placement date Placement time Site Days   Peripheral IV 03/07/23 20 G Right Antecubital 03/07/23  1653  Antecubital  less than 1   External Urinary Catheter 03/07/23  2214  --  less than 1            Intake/Output Last 24 hours  Intake/Output Summary (Last 24 hours) at 03/07/2023 2259 Last data filed at 03/07/2023 2200 Gross per 24 hour  Intake --  Output 410 ml  Net -410 ml    Labs/Imaging Results for orders placed or performed during the hospital encounter of 03/07/23 (from the past 48 hour(s))  CBC with Differential/Platelet     Status: Abnormal   Collection Time: 03/07/23  4:55 PM  Result Value Ref Range   WBC 9.2 4.0 - 10.5 K/uL   RBC 5.28 (H) 3.87 - 5.11 MIL/uL   Hemoglobin 16.9 (H) 12.0 - 15.0 g/dL   HCT 30.8 (H) 65.7 - 84.6 %   MCV 93.6 80.0 - 100.0 fL   MCH 32.0 26.0 - 34.0 pg   MCHC 34.2 30.0 - 36.0 g/dL   RDW 96.2 95.2 - 84.1 %   Platelets 198 150 - 400 K/uL   nRBC 0.0 0.0 - 0.2 %   Neutrophils Relative % 67 %   Neutro Abs 6.1 1.7 - 7.7 K/uL   Lymphocytes Relative 25 %  Lymphs Abs 2.2 0.7 - 4.0 K/uL   Monocytes Relative 6 %   Monocytes Absolute 0.6 0.1 - 1.0 K/uL   Eosinophils Relative 1 %   Eosinophils Absolute 0.1 0.0 - 0.5 K/uL   Basophils Relative 1 %   Basophils Absolute 0.1 0.0 - 0.1 K/uL   Immature Granulocytes 0 %   Abs Immature Granulocytes 0.04 0.00 - 0.07 K/uL    Comment: Performed at [facility], [address], [place], [place] [address]  Comprehensive metabolic panel     Status: Abnormal   Collection Time: 03/07/23  4:55 PM  Result Value Ref Range   Sodium 137 135 - 145 mmol/L   Potassium 3.7 3.5 - 5.1 mmol/L   Chloride 100 98 - 111 mmol/L   CO2 26 22 - 32 mmol/L   Glucose, Bld 204 (H) 70 - 99 mg/dL    Comment: Glucose reference range applies only to samples taken after fasting for at least 8 hours.   BUN 12 8 - 23 mg/dL   Creatinine, Ser 6.04 (H) 0.44 - 1.00 mg/dL   Calcium 54.0 8.9 - 98.1  mg/dL   Total Protein 7.3 6.5 - 8.1 g/dL   Albumin 3.8 3.5 - 5.0 g/dL   AST 25 15 - 41 U/L   ALT 18 0 - 44 U/L   Alkaline Phosphatase 48 38 - 126 U/L   Total Bilirubin 1.9 (H) 0.3 - 1.2 mg/dL   GFR, Estimated 52 (L) >60 mL/min    Comment: (NOTE) Calculated using the CKD-EPI Creatinine Equation (2021)    Anion gap 11 5 - 15    Comment: Performed at [facility], [address] [place], [place] [address]  Magnesium     Status: None   Collection Time: 03/07/23  4:55 PM  Result Value Ref Range   Magnesium 1.8 1.7 - 2.4 mg/dL    Comment: Performed at [facility], [address] [place], [place] [address]  TSH     Status: None   Collection Time: 03/07/23  4:55 PM  Result Value Ref Range   TSH 3.284 0.350 - 4.500 uIU/mL    Comment: Performed by a 3rd Generation assay with a functional sensitivity of <=0.01 uIU/mL. Performed at [facility], [address] [place], [place] [address]    DG Chest Port 1 View  Result Date: 03/07/2023 CLINICAL DATA:  Syncope EXAM: PORTABLE CHEST 1 VIEW COMPARISON:  X-ray 01/02/2022 FINDINGS: No consolidation, pneumothorax or effusion. No edema. Normal cardiopericardial silhouette. Calcified aorta. Overlapping cardiac leads and defibrillator pads. The extreme lung apices are clipped off the edge of the film. IMPRESSION: No acute cardiopulmonary disease. Electronically Signed   By: Karen Kays M.D.   On: 03/07/2023 17:39    Pending Labs Unresulted Labs (From admission, onward)     Start     Ordered   03/08/23 0500  Basic metabolic panel  Tomorrow morning,   R       Comments: As Scheduled for 5 days    03/07/23 2038            Vitals/Pain Today's Vitals   03/07/23 1928 03/07/23 2000 03/07/23 2130 03/07/23 2215  BP:  (!) 123/53  133/68  Pulse:  (!) 43 (!) 43 (!) 43  Resp:  (!) 22 13 15   Temp:      TempSrc:      SpO2:  100% 100% 99%  PainSc: 0-No pain  Isolation Precautions No active  isolations  Medications Medications  sodium chloride flush (NS) 0.9 % injection 3 mL (has no administration in time range)  sodium chloride flush (NS) 0.9 % injection 3 mL (has no administration in time range)  0.9 %  sodium chloride infusion (has no administration in time range)  acetaminophen (TYLENOL) tablet 650 mg (has no administration in time range)  ondansetron (ZOFRAN) injection 4 mg (has no administration in time range)    Mobility walks with device     Focused Assessments See chart   R Recommendations: See Admitting Provider Note  Report given to:   Additional Notes: Pt has purewick in place. Dtr at bedside

## 2023-03-07 NOTE — H&P (Signed)
Cardiology Admission History and Physical   Patient ID: Jasmine Crawford MRN: [id number]; DOB: 03-10-1941   Admission date: 03/07/2023  PCP:  Marguerita Beards, MD   [facility] Providers Cardiologist:  Dietrich Pates, md}     Chief Complaint:  Presents for syncope and CHB  Patient Profile:   Jasmine Crawford is a 82 y.o. female with CAD  who is being seen 03/07/2023 for the evaluation of complete heart block and syncope  History of Present Illness:   Jasmine Crawford is a 82 yo with hx of DM, HTN, HL, hypothyroidism, dementia and CAD  CAD (CCTA April 2023:  LM  FFR negative; plan for conservative Rx    Last seen in cardiology clnic in September 2023  On atenolol, amlodipine and HCTZ for HTN   In Apri 2024 family called   Visiting nurse had been in   HR 48   Intermittent dizziness   Treated for UTI  Recommended that she cut back on  amlodipine to 2.5 and atenolol to 12.5 mg   a monitor was sent to patient      Unfortunately it was sent to her home address and she has been at dqughters  Daughter just picked up 2 days ago The pt has been doing overall OK at home   Intermitt dizziness  No syncope   No CP  Today she was at home with daughter   Had been feeling OK the past couple days    Had lunch  She was in chair slouched   Daughter tried to sit her up but she became more rigid, started staring, unresponsive.     Lasted about 3 to 4 min then responded Pt denied CP    Per reprote patient was  laid down on floor.     EMS called BP normal   HR slow    Here in ED notes some mild chest pressure   No dizziness now     She did not take her medicines today   Past Medical History:  Diagnosis Date   Arthritis    Blind left eye    Diabetes (HCC)    Hyperlipidemia    Hypertension    Hypothyroid    PAD (peripheral artery disease) (HCC)     Past Surgical History:  Procedure Laterality Date   EYE SURGERY Left    had glaucoma     Medications Prior to Admission: Prior to Admission medications    Medication Sig Start Date End Date Taking? Authorizing Provider  albuterol (VENTOLIN HFA) 108 (90 Base) MCG/ACT inhaler Inhale 1-2 puffs into the lungs every 6 (six) hours as needed for wheezing or shortness of breath.   Yes [provider]  amLODipine (NORVASC) 2.5 MG tablet Take 1 tablet (2.5 mg total) by mouth daily. Patient taking differently: Take 2.5 mg by mouth at bedtime. 01/29/23 04/29/23 Yes Pricilla Riffle, MD  ammonium lactate (LAC-HYDRIN) 12 % lotion Apply 1 application. topically as needed for dry skin.   Yes [provider]  aspirin EC 81 MG tablet Take 81 mg by mouth in the morning. Swallow whole.   Yes [provider]  atenolol (TENORMIN) 25 MG tablet Take 0.5 tablets (12.5 mg total) by mouth daily. Patient taking differently: Take 12.5 mg by mouth at bedtime. 01/29/23  Yes Pricilla Riffle, MD  FLUoxetine (PROZAC) 20 MG capsule Take 20 mg by mouth in the morning.   Yes [provider]  fluticasone (FLONASE) 50 MCG/ACT nasal spray Place  1 spray into both nostrils 2 (two) times daily as needed for allergies or rhinitis.   Yes [provider]  hydrochlorothiazide (HYDRODIURIL) 25 MG tablet Take 25 mg by mouth at bedtime.   Yes [provider]  latanoprost (XALATAN) 0.005 % ophthalmic solution Place 1 drop into both eyes at bedtime.   Yes [provider]  memantine (NAMENDA) 10 MG tablet Take 10 mg by mouth in the morning.   Yes [provider]  metFORMIN (GLUCOPHAGE-XR) 500 MG 24 hr tablet Take 500 mg by mouth daily with breakfast.   Yes [provider]  naproxen (NAPROSYN) 500 MG tablet Take 500 mg by mouth 2 (two) times daily as needed for mild pain.   Yes [provider]  nitroGLYCERIN (NITROSTAT) 0.4 MG SL tablet Place 1 tablet (0.4 mg total) under the tongue every 5 (five) minutes as needed for chest pain. 01/24/22 03/07/23 Yes Dyann Kief, PA-C  potassium chloride SA (KLOR-CON M) 20 MEQ tablet Take  20 mEq by mouth in the morning.   Yes [provider]  rosuvastatin (CRESTOR) 20 MG tablet Take 1 tablet (20 mg total) by mouth daily. 01/03/22 03/07/23 Yes Arrien, York Ram, MD  TYLENOL 8 HOUR ARTHRITIS PAIN 650 MG CR tablet Take 1,300 mg by mouth every 8 (eight) hours as needed for pain.   Yes [provider]     Allergies:    Allergies  Allergen Reactions   Bacitracin Swelling and Other (See Comments)    "Swelling," but not swelling of the throat   Erythromycin Base Swelling and Other (See Comments)    "Swelling," but not swelling of the throat   Losartan Other (See Comments)    Reaction not recalled   Ramipril Other (See Comments)    Reaction not recalled     Social History:   Social History   Socioeconomic History   Marital status: Widowed    Spouse name: Not on file   Number of children: Not on file   Years of education: Not on file   Highest education level: Not on file  Occupational History   Not on file  Tobacco Use   Smoking status: Never   Smokeless tobacco: Never  Vaping Use   Vaping Use: Never used  Substance and Sexual Activity   Alcohol use: Never   Drug use: Never   Sexual activity: Never  Other Topics Concern   Not on file  Social History Narrative   Not on file   Social Determinants of Health   Financial Resource Strain: Not on file  Food Insecurity: Not on file  Transportation Needs: Not on file  Physical Activity: Not on file  Stress: Not on file  Social Connections: Not on file  Intimate Partner Violence: Not on file    Family History:   Not contrib to above problem  ROS:  Please see the history of present illness.  All other ROS reviewed and negative.     Physical Exam/Data:   Vitals:   03/07/23 1652 03/07/23 1800  BP: (!) 152/66 129/64  Pulse: (!) 46 (!) 44  Resp: 14 13  Temp: 97.7 F (36.5 C)   TempSrc: Oral   SpO2: 100% 100%   No intake or output data in the 24 hours ending 03/07/23 1817     06/05/2022   11:37 AM 01/24/2022    8:31 AM 01/01/2022    1:35 AM  Last 3 Weights  Weight (lbs) 177 lb 185 lb 185 lb  14.4 oz  Weight (kg) 80.287 kg 83.915 kg 84.324 kg     There is no height or weight on file to calculate BMI.  General:  Well nourished, well developed, in no acute distress HEENT: normal Neck: no JVD Vascular: No carotid bruits; Distal pulses 2+ bilaterally   Cardiac:  normal S1, S2; RRR; no murmur  Lungs:  clear to auscultation bilaterally, no wheezing, rhonchi or rales  Abd: soft, nontender, no hepatomegaly  Ext: no edema Musculoskeletal:  No deformities, BUE and BLE strength normal and equal Skin: warm and dry  Neuro:  CN II to XII intact  No gross abnormallities Psych  Pt appropriate in conversation.  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    Chemistry Recent Labs  Lab 03/07/23 1655  NA 137  K 3.7  CL 100  CO2 26  GLUCOSE 204*  BUN 12  CREATININE 1.06*  CALCIUM 10.3  MG 1.8  GFRNONAA 52*  ANIONGAP 11    Recent Labs  Lab 03/07/23 1655  PROT 7.3  ALBUMIN 3.8  AST 25  ALT 18  ALKPHOS 48  BILITOT 1.9*   Lipids No results for input(s): "CHOL", "TRIG", "HDL", "LABVLDL", "LDLCALC", "CHOLHDL" in the last 168 hours. Hematology Recent Labs  Lab 03/07/23 1655  WBC 9.2  RBC 5.28*  HGB 16.9*  HCT 49.4*  MCV 93.6  MCH 32.0  MCHC 34.2  RDW 14.6  PLT 198   Thyroid  Recent Labs  Lab 03/07/23 1655  TSH 3.284   BNPNo results for input(s): "BNP", "PROBNP" in the last 168 hours.  DDimer No results for input(s): "DDIMER" in the last 168 hours.   Radiology/Studies:  DG Chest Port 1 View  Result Date: 03/07/2023 CLINICAL DATA:  Syncope EXAM: PORTABLE CHEST 1 VIEW COMPARISON:  X-ray 01/02/2022  FINDINGS: No consolidation, pneumothorax or effusion. No edema. Normal cardiopericardial silhouette. Calcified aorta. Overlapping cardiac leads and defibrillator pads. The extreme lung apices are clipped off the edge of the film. IMPRESSION: No acute cardiopulmonary disease. Electronically Signed   By: Karen Kays M.D.   On: 03/07/2023 17:39     CCTA   Apirl 2023  1. Coronary calcium score of 248. This was 63rd percentile for age-, sex, and race-matched controls.   2. Normal coronary origin with right dominance.   3. 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   4. Moderate LCX stenosis (50-69%).   5. Mild CAD (25-49%) in the LAD/RCA.  FFR  1. Left Main: 0.94; low likelihood of hemodynamic significance.   2. Prox LAD: 0.86; low likelihood of hemodynamic significance. 3. Mid LAD: 0.83; low likelihood of hemodynamic significance. 4. Prox LCX: 0.87; low likelihood of hemodynamic significance. 5. Distal LCX: 0.80; low likelihood of hemodynamic significance. 6. RCA: 0.88; low likelihood of hemodynamic significance.   IMPRESSION:   1. CT FFR negative. CCTA concerning for left main disease >50%. Would recommend cardiac cath +/- IVUS for clarification.  Assessment and Plan:   Complete HB  Ventricular rate is 40s    Her BP is OK here in ED   Review of EKGs from past she has had LBBB in past, but not in Sept 2023   Now with  RBBB / LAFB      Discussed with daughter and patient   Although meds may contrib to the slow HR I think her native conduction has slowed    Would continue close monitoring overnight   Right now BP is OK Will review with EP re PPM placement tomorrow  2   CAD  Pt with CAD on CT scan in April 2023   FFR was normal at time  but given LM dz recomm [facility]   Pt declined   She hs done OK without CP since    Will get an echo in am to reassess LVEF  3  Hx HTN  Hold meds   4  Dementia   Will hold meds   REview with pharmacy if they contrib to bradycardia         For  questions or updates, please contact [facility] Please consult [email or web address] for contact info under     Signed, Dietrich Pates, MD  03/07/2023 6:17 PM

## 2023-03-07 NOTE — ED Notes (Signed)
Dtr, Elonda Husky, [phone]

## 2023-03-08 ENCOUNTER — Encounter (HOSPITAL_COMMUNITY): Payer: Self-pay | Admitting: Internal Medicine

## 2023-03-08 ENCOUNTER — Observation Stay (HOSPITAL_COMMUNITY): Payer: Medicare HMO

## 2023-03-08 DIAGNOSIS — R55 Syncope and collapse: Secondary | ICD-10-CM | POA: Diagnosis not present

## 2023-03-08 DIAGNOSIS — I442 Atrioventricular block, complete: Principal | ICD-10-CM

## 2023-03-08 DIAGNOSIS — I4519 Other right bundle-branch block: Secondary | ICD-10-CM

## 2023-03-08 LAB — BASIC METABOLIC PANEL
Anion gap: 10 (ref 5–15)
BUN: 14 mg/dL (ref 8–23)
CO2: 25 mmol/L (ref 22–32)
Calcium: 9.8 mg/dL (ref 8.9–10.3)
Chloride: 103 mmol/L (ref 98–111)
Creatinine, Ser: 0.84 mg/dL (ref 0.44–1.00)
GFR, Estimated: >60 mL/min (ref >60)
Glucose, Bld: 124 mg/dL — ABNORMAL HIGH (ref 70–99)
Potassium: 3.3 mmol/L — ABNORMAL LOW (ref 3.5–5.1)
Sodium: 138 mmol/L (ref 135–145)

## 2023-03-08 LAB — ECHOCARDIOGRAM COMPLETE
AR max vel: 2.23 cm2
AV Area VTI: 2.11 cm2
AV Area mean vel: 2.21 cm2
AV Mean grad: 5.5 mmHg
AV Peak grad: 11 mmHg
Ao pk vel: 1.66 m/s
Area-P 1/2: 2.33 cm2
Calc EF: 54.8 %
Est EF: 55
Height: 64 in
MV VTI: 2.8 cm2
S' Lateral: 2.8 cm
Single Plane A2C EF: 58.2 %
Single Plane A4C EF: 55.4 %
Weight: 2447.99 oz

## 2023-03-08 LAB — MRSA NEXT GEN BY PCR, NASAL: MRSA by PCR Next Gen: NOT DETECTED

## 2023-03-08 MED ORDER — SODIUM CHLORIDE 0.9% FLUSH
3.0000 mL | Freq: Two times a day (BID) | INTRAVENOUS | Status: DC
  Administered 2023-03-08 – 2023-03-13 (×9): 3 mL via INTRAVENOUS

## 2023-03-08 MED ORDER — ORAL CARE MOUTH RINSE: 15.0000 mL | OROMUCOSAL | Status: DC | PRN

## 2023-03-08 MED ORDER — POTASSIUM CHLORIDE CRYS ER 20 MEQ PO TBCR
40.0000 meq | EXTENDED_RELEASE_TABLET | Freq: Once | ORAL | Status: AC
  Administered 2023-03-08: 40 meq via ORAL
  Filled 2023-03-08: qty 2

## 2023-03-08 MED ORDER — CHLORHEXIDINE GLUCONATE CLOTH 2 % EX PADS
6.0000 | MEDICATED_PAD | Freq: Every day | CUTANEOUS | Status: DC
  Administered 2023-03-08 – 2023-03-10 (×4): 6 via TOPICAL

## 2023-03-08 MED ORDER — ENSURE ENLIVE PO LIQD
237.0000 mL | Freq: Two times a day (BID) | ORAL | Status: DC
  Administered 2023-03-08 – 2023-03-13 (×6): 237 mL via ORAL

## 2023-03-08 NOTE — Progress Notes (Signed)
Initial Nutrition Assessment  DOCUMENTATION CODES:   Not applicable  INTERVENTION:  - Continue Ensure Enlive po BID, each supplement provides 350 kcal and 20 grams of protein.  NUTRITION DIAGNOSIS:   Inadequate oral intake related to poor appetite as evidenced by per patient/family report.  GOAL:   Patient will meet greater than or equal to 90% of their needs  MONITOR:   PO intake  REASON FOR ASSESSMENT:   Malnutrition Screening Tool    ASSESSMENT:   82 y.o. female admits related to syncope. PMH includes: DM, HTN, HLD, hypothyroidism, dementia, CAD. Pt is currently receiving medical management related to complete heart block.  Meds reviewed. Labs reviewed: K low.   Pt out of room at time of assessment. Per record, pt has eaten 75-100% of her meals since admission. Pt currently has Ensure Enlive BID ordered. The pt is likely meeting her needs at this time. RD will continue to monitor PO intakes.   NUTRITION - FOCUSED PHYSICAL EXAM:  Remote assessment.  Diet Order:   Diet Order             Diet NPO time specified Except for: Sips with Meds  Diet effective midnight           Diet Heart Room service appropriate? Yes; Fluid consistency: Thin  Diet effective now                   EDUCATION NEEDS:   Not appropriate for education at this time  Skin:  Skin Assessment: Reviewed RN Assessment  Last BM:  6/15 - type 4  Height:   Ht Readings from Last 1 Encounters:  03/08/23 5\' 4"  (1.626 m)    Weight:   Wt Readings from Last 1 Encounters:  03/09/23 69.5 kg    Ideal Body Weight:     BMI:  Body mass index is 26.3 kg/m.  Estimated Nutritional Needs:   Kcal:  1735-2080 kcals  Protein:  85-105 gm  Fluid:  >/= 1.7 L  Bethann Humble, RD, LDN, CNSC.

## 2023-03-08 NOTE — Progress Notes (Signed)
0015 patient arrived from ED alert to self and place unsure why she was being admitted. Education given to patient but she is forgetful. Daughter Leonides Schanz at bedside will be back later today and wants a call from the Doctor and or nurse if any procedure will occur today phone number in chart under patient contacts. Patient brady throughout the night Blood pressure stable.  0530 bladder scanned since patient has not urinated and needed to be straight cath prior to arrival to unit. Bladder scanned noted to be 215 ml. Patient has no urge to urinate at this time.

## 2023-03-08 NOTE — TOC Initial Note (Signed)
Transition of Care [facility]) - Initial/Assessment Note    Patient Details  Name: Jasmine Crawford MRN: [id number] Date of Birth: 01-11-1941  Transition of Care [facility]) CM/SW Contact:    Gala Lewandowsky, RN Phone Number: 03/08/2023, 4:18 PM  Clinical Narrative:  Patient presented for syncope and complete heart block. Plan for pacemaker implant 03-11-23. PTA patient was from home with daughter Elonda Husky. Patient has one daughter in [place] as well and they try to alternate care. Patient has DME cane, rolling walker, and wheelchair in the home. Daughter states patient has used [facility] in the past for PT/OT/SLP/SW; however, were not able to resume the services per insurance. Patient will benefit from PT/OT consult once stable. Elonda Husky states that her and her sister are discussing plans for the patient; questioning if the patient should return to [place] with daughter that works from home vs stay in [place] with Elonda Husky that works and patient would be in the home for extended hours alone. Case Manager called [employer] and they cannot service the [place] zip code area. Cassandra asked if her mother is eligible for Medicaid. Case Manager did ask CSW to see if she could submit to the [facility] to see if eligible. Sisters to discuss plan of care over the weekend and Case Manager will visit the patient on Monday. No further needs identified at this time.              Expected Discharge Plan: Home w Home Health Services Barriers to Discharge: No Barriers Identified   Patient Goals and CMS Choice Patient states their goals for this hospitalization and ongoing recovery are:: undecided if the patient will return home with daughter in [place] vs daughter in [place].   Choice offered to / list presented to : NA      Expected Discharge Plan and Services In-house Referral: Clinical Social Work Discharge Planning Services: CM Consult Post Acute Care Choice: Home Health Living arrangements for the past 2 months:  Single Family Home                   DME Agency: NA  Prior Living Arrangements/Services Living arrangements for the past 2 months: Single Family Home Lives with:: Adult Children Patient language and need for interpreter reviewed:: Yes Do you feel safe going back to the place where you live?: Yes      Need for Family Participation in Patient Care: Yes (Comment) Care giver support system in place?: Yes (comment) Current home services: DME (cane, rolling walker, wheelchair.) Criminal Activity/Legal Involvement Pertinent to Current Situation/Hospitalization: No - Comment as needed  Activities of Daily Living Home Assistive Devices/Equipment: Walker (specify type) ADL Screening (condition at time of admission) Patient's cognitive ability adequate to safely complete daily activities?: Yes Is the patient deaf or have difficulty hearing?: Yes Does the patient have difficulty seeing, even when wearing glasses/contacts?: Yes Does the patient have difficulty concentrating, remembering, or making decisions?: Yes Patient able to express need for assistance with ADLs?: Yes Does the patient have difficulty dressing or bathing?: No Independently performs ADLs?: Yes (appropriate for developmental age) Does the patient have difficulty walking or climbing stairs?: Yes Weakness of Legs: None Weakness of Arms/Hands: None  Permission Sought/Granted Permission sought to share information with : Family Supports, Case Manager   Emotional Assessment Appearance:: Appears stated age Attitude/Demeanor/Rapport: Engaged Affect (typically observed): Appropriate Orientation: : Oriented to Self, Oriented to Place Alcohol / Substance Use: Not Applicable Psych Involvement: No (comment)  Admission diagnosis:  Complete heart block (HCC) [I44.2]  Complete heart block by electrocardiogram [facility]) [I44.2] Syncope, unspecified syncope type [R55] Patient Active Problem List   Diagnosis Date Noted   Complete heart  block by electrocardiogram (HCC) 03/07/2023   Hypertensive urgency 01/01/2022   Thrombocytopenia (HCC) 01/01/2022   Type 2 diabetes mellitus with hyperlipidemia (HCC) 01/01/2022   Hypothyroidism 01/01/2022   Peripheral arterial disease (HCC) 01/01/2022   Dementia (HCC) 01/01/2022   Class 1 obesity 01/01/2022   Chest pain 12/31/2021   PCP:  Marguerita Beards, MD Pharmacy:   [facility] [phone] - [address] [place] - [address] [place] [place] [address] Phone: [phone] Fax: [phone]  Social Determinants of Health (SDOH) Social History: SDOH Screenings   Food Insecurity: Food Insecurity Present (03/08/2023)  Housing: Medium Risk (03/08/2023)  Transportation Needs: No Transportation Needs (03/08/2023)  Utilities: Not At Risk (03/08/2023)  Tobacco Use: Low Risk  (03/08/2023)   Readmission Risk Interventions     No data to display

## 2023-03-08 NOTE — Consult Note (Addendum)
Cardiology Consultation   Patient ID: Jasmine Crawford MRN: [id number]; DOB: Mar 24, 1941  Admit date: 03/07/2023 Date of Consult: 03/08/2023  PCP:  Marguerita Beards, MD   [facility] Providers Cardiologist:  Dietrich Pates, MD   {   Patient Profile:   Jasmine Crawford is a 82 y.o. female with a hx of CAD (CCTA April 2023: LM FFR negative; plan for conservative Rx), HTN, HLD, DM, hypothyroidism, dementia who is being seen 03/08/2023 for the evaluation of CHB at the request of Dr. Tenny Craw.  History of Present Illness:   Jasmine Crawford in April family reached out with reports of some dizziness and slow HRs, she was being treated for a UTI, though also advised to reduce her amlodipine and atenolol dose to 12.5mg   She was admitted yesterday after a syncopal event, occurred while seated daughter saw her slouched in the chair, by chart report, her daughter tried to sit her up but she became more rigid, started staring, unresponsive.     Lasted about 3 to 4 min then responded   She was found in CHB 40's, admitted, home meds held.  LABS K+ 3.7 > 3.3 Mag 1.8 BUN/creat 14/0.84 WBC 9.2 H/H 16/49 Plts 198 TSH 3.284  The patient reports feeling "just fine" current;y, no CP, though zoll pad bothers her, no SOB. She does tell me that she has been a little lightheaded or dizzy of late, but had never passed out before. Her recollection is  that she was finishing up eating when she started to feel poorly and just went out.  She was seated at a table that was high, and did not fall out of the chair, she woke to her daughter helping her.  She thinks she takes most of her meds in the morning time, believes she would have taken metoprolol yesterday morning, but never really takes it at the same time, and can not recall for sure what time her metoprolol dose was at home  She carries dx of dementia, she is currently AAO to self, place, situation, knows the president, needed help with the year   Past Medical  History:  Diagnosis Date   Arthritis    Blind left eye    Diabetes (HCC)    Hyperlipidemia    Hypertension    Hypothyroid    PAD (peripheral artery disease) (HCC)     Past Surgical History:  Procedure Laterality Date   EYE SURGERY Left    had glaucoma     Home Medications:  Prior to Admission medications   Medication Sig Start Date End Date Taking? Authorizing Provider  albuterol (VENTOLIN HFA) 108 (90 Base) MCG/ACT inhaler Inhale 1-2 puffs into the lungs every 6 (six) hours as needed for wheezing or shortness of breath.   Yes [provider]  amLODipine (NORVASC) 2.5 MG tablet Take 1 tablet (2.5 mg total) by mouth daily. Patient taking differently: Take 2.5 mg by mouth at bedtime. 01/29/23 04/29/23 Yes Pricilla Riffle, MD  ammonium lactate (LAC-HYDRIN) 12 % lotion Apply 1 application. topically as needed for dry skin.   Yes [provider]  aspirin EC 81 MG tablet Take 81 mg by mouth in the morning. Swallow whole.   Yes [provider]  atenolol (TENORMIN) 25 MG tablet Take 0.5 tablets (12.5 mg total) by mouth daily. Patient taking differently: Take 12.5 mg by mouth at bedtime. 01/29/23  Yes Pricilla Riffle, MD  FLUoxetine (PROZAC) 20 MG capsule Take 20 mg by mouth in the morning.  Yes [provider]  fluticasone (FLONASE) 50 MCG/ACT nasal spray Place 1 spray into both nostrils 2 (two) times daily as needed for allergies or rhinitis.   Yes [provider]  hydrochlorothiazide (HYDRODIURIL) 25 MG tablet Take 25 mg by mouth at bedtime.   Yes [provider]  latanoprost (XALATAN) 0.005 % ophthalmic solution Place 1 drop into both eyes at bedtime.   Yes [provider]  memantine (NAMENDA) 10 MG tablet Take 10 mg by mouth in the morning.   Yes [provider]  metFORMIN (GLUCOPHAGE-XR) 500 MG 24 hr tablet Take 500 mg by mouth daily with breakfast.   Yes [provider]  naproxen (NAPROSYN) 500 MG tablet Take 500  mg by mouth 2 (two) times daily as needed for mild pain.   Yes [provider]  nitroGLYCERIN (NITROSTAT) 0.4 MG SL tablet Place 1 tablet (0.4 mg total) under the tongue every 5 (five) minutes as needed for chest pain. 01/24/22 03/07/23 Yes Dyann Kief, PA-C  potassium chloride SA (KLOR-CON M) 20 MEQ tablet Take 20 mEq by mouth in the morning.   Yes [provider]  rosuvastatin (CRESTOR) 20 MG tablet Take 1 tablet (20 mg total) by mouth daily. 01/03/22 03/07/23 Yes Arrien, York Ram, MD  TYLENOL 8 HOUR ARTHRITIS PAIN 650 MG CR tablet Take 1,300 mg by mouth every 8 (eight) hours as needed for pain.   Yes [provider]    Inpatient Medications: Scheduled Meds:  Chlorhexidine Gluconate Cloth  6 each Topical Q0600   feeding supplement  237 mL Oral BID BM   sodium chloride flush  3 mL Intravenous Q12H   Continuous Infusions:  sodium chloride     PRN Meds: sodium chloride, acetaminophen, ondansetron (ZOFRAN) IV, sodium chloride flush  Allergies:    Allergies  Allergen Reactions   Bacitracin Swelling and Other (See Comments)    "Swelling," but not swelling of the throat   Erythromycin Base Swelling and Other (See Comments)    "Swelling," but not swelling of the throat   Losartan Other (See Comments)    Reaction not recalled   Ramipril Other (See Comments)    Reaction not recalled     Social History:   Social History   Socioeconomic History   Marital status: Widowed    Spouse name: Not on file   Number of children: Not on file   Years of education: Not on file   Highest education level: Not on file  Occupational History   Not on file  Tobacco Use   Smoking status: Never   Smokeless tobacco: Never  Vaping Use   Vaping Use: Never used  Substance and Sexual Activity   Alcohol use: Never   Drug use: Never   Sexual activity: Never  Other Topics Concern   Not on file  Social History Narrative   Not on file   Social Determinants of Health    Financial Resource Strain: Not on file  Food Insecurity: Food Insecurity Present (03/08/2023)   Hunger Vital Sign    Worried About Running Out of Food in the Last Year: Sometimes true    Ran Out of Food in the Last Year: Sometimes true  Transportation Needs: No Transportation Needs (03/08/2023)   PRAPARE - Administrator, Civil Service (Medical): No    Lack of Transportation (Non-Medical): No  Physical Activity: Not on file  Stress: Not on file  Social Connections: Not on file  Intimate Partner Violence: Not  At Risk (03/08/2023)   Humiliation, Afraid, Rape, and Kick questionnaire    Fear of Current or Ex-Partner: No    Emotionally Abused: No    Physically Abused: No    Sexually Abused: No    Family History:  History reviewed. No pertinent family history.   ROS:  Please see the history of present illness.  All other ROS reviewed and negative.     Physical Exam/Data:   Vitals:   03/08/23 0630 03/08/23 0700 03/08/23 0800 03/08/23 0900  BP: (!) 144/62 138/60 (!) 140/66 (!) 160/61  Pulse: (!) 39 (!) 40 (!) 39 (!) 40  Resp: 10 (!) 9 17 15   Temp:      TempSrc:      SpO2: 95% 97% 97% 99%  Weight:      Height:        Intake/Output Summary (Last 24 hours) at 03/08/2023 0913 Last data filed at 03/07/2023 2200 Gross per 24 hour  Intake --  Output 410 ml  Net -410 ml      03/08/2023    1:00 AM 03/08/2023   12:15 AM 06/05/2022   11:37 AM  Last 3 Weights  Weight (lbs) 153 lb 153 lb 177 lb  Weight (kg) 69.4 kg 69.4 kg 80.287 kg     Body mass index is 26.26 kg/m.  General:  Well nourished though thin, in no acute distress HEENT: normal Neck: no JVD Vascular: No carotid bruits Cardiac:  RRR; bradycardic, no murmurs, gallops or rubs Lungs:  CTA b/l, no wheezing, rhonchi or rales  Abd: soft, nontender Ext: no edema Musculoskeletal:  No deformities Skin: warm and dry  Neuro:   no focal motor abnormalities noted Psych:  Normal affect   EKG:  The EKG was  personally reviewed and demonstrates:    CHB 46bpm, RBBB, LAD  OLD 06/05/22: SR 70bpm, PVC, LAD, narrow QRS 86ms   Telemetry:  Telemetry was personally reviewed and demonstrates:    CHB high 30's-40 bpm    Relevant CV Studies:  01/01/22: TTE 1. Left ventricular ejection fraction, by estimation, is 60 to 65%. The  left ventricle has normal function. The left ventricle has no regional  wall motion abnormalities. There is mild concentric left ventricular  hypertrophy. Left ventricular diastolic  parameters are consistent with Grade I diastolic dysfunction (impaired  relaxation).   2. Right ventricular systolic function is normal. The right ventricular  size is mildly enlarged. There is normal pulmonary artery systolic  pressure. The estimated right ventricular systolic pressure is 14.4 mmHg.   3. Left atrial size was mildly dilated.   4. The mitral valve is normal in structure. Trivial mitral valve  regurgitation.   5. The aortic valve is tricuspid. There is mild calcification of the  aortic valve. There is moderate thickening of the aortic valve. Aortic  valve regurgitation is not visualized. Aortic valve  sclerosis/calcification is present, without any evidence of  aortic stenosis.   6. Aortic dilatation noted. There is borderline dilatation of the  ascending aorta, measuring 38 mm.   7. The inferior vena cava is normal in size with greater than 50%  respiratory variability, suggesting right atrial pressure of 3 mmHg.   Comparison(s): No prior Echocardiogram.   12/31/21: Coronary CT IMPRESSION: 1. Coronary calcium score of 248. This was 63rd percentile for age-, sex, and race-matched controls.   2. Normal coronary origin with right dominance.   3. 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   4. Moderate LCX stenosis (50-69%).  5. Mild CAD (25-49%) in the LAD/RCA.  RECOMMENDATIONS: 1. Severe stenosis. (70-99% or > 50% left main). Cardiac catheterization is recommended.  CT FFR will be submitted. Consider symptom-guided anti-ischemic pharmacotherapy as well as risk factor modification per guideline directed care. Invasive coronary angiography recommended with revascularization per published guideline statements.  IMPRESSION:   1. CT FFR negative. CCTA concerning for left main disease >50%. Would recommend cardiac cath +/- IVUS for clarification.  Laboratory Data:  High Sensitivity Troponin:  No results for input(s): "TROPONINIHS" in the last 720 hours.   Chemistry Recent Labs  Lab 03/07/23 1655 03/08/23 0550  NA 137 138  K 3.7 3.3*  CL 100 103  CO2 26 25  GLUCOSE 204* 124*  BUN 12 14  CREATININE 1.06* 0.84  CALCIUM 10.3 9.8  MG 1.8  --   GFRNONAA 52* >60  ANIONGAP 11 10    Recent Labs  Lab 03/07/23 1655  PROT 7.3  ALBUMIN 3.8  AST 25  ALT 18  ALKPHOS 48  BILITOT 1.9*   Lipids No results for input(s): "CHOL", "TRIG", "HDL", "LABVLDL", "LDLCALC", "CHOLHDL" in the last 168 hours.  Hematology Recent Labs  Lab 03/07/23 1655  WBC 9.2  RBC 5.28*  HGB 16.9*  HCT 49.4*  MCV 93.6  MCH 32.0  MCHC 34.2  RDW 14.6  PLT 198   Thyroid  Recent Labs  Lab 03/07/23 1655  TSH 3.284    BNPNo results for input(s): "BNP", "PROBNP" in the last 168 hours.  DDimer No results for input(s): "DDIMER" in the last 168 hours.   Radiology/Studies:  DG Chest Port 1 View Result Date: 03/07/2023 CLINICAL DATA:  Syncope EXAM: PORTABLE CHEST 1 VIEW COMPARISON:  X-ray 01/02/2022 FINDINGS: No consolidation, pneumothorax or effusion. No edema. Normal cardiopericardial silhouette. Calcified aorta. Overlapping cardiac leads and defibrillator pads. The extreme lung apices are clipped off the edge of the film. IMPRESSION: No acute cardiopulmonary disease. Electronically Signed   By: Karen Kays M.D.   On: 03/07/2023 17:39     Assessment and Plan:   CHB with a new RBBB escape rhythm Home atenolol 12.5mg  (1/2 life is 6-7 hours) Labs unrevealing Updated  echo is pending  Not convinced such a low dose of atenolol plays a significant role, though potentially a reversible cause I discussed possibility of PPM with the patient she is reluctant to h ave any procedures She tells me she is mostly independent, ambulates on her own, lives with her daughter's, enjoys time with her family One of her daughters thought to be on her way and will be here.  Will keep her NPO for now, see her back with Dr. Graciela Husbands a little later this morning   ADDEND: I have discussed with the patient again with her daughter at bedside and another daughter on speaker phone.   Last metoprolol dose was 6/12/evening Discussed CHB diagnosis, rational for PPM, discussed implant procedure, potential benefits/risks, daughters are leaning towards wanting the procedure done, patient not yet on board Discussed while currently clinically she is doing well and stable hemodynamically with her escape rhythm/rate no way of knowing for how long she would remain stable They were going to think about PPM the patient still not quite on board.  Dr. Graciela Husbands has now also been to bedside as well, further discussed, the patient and family not yet on board and would like the weekend to think about it and discuss further.  Diet ordered    Risk Assessment/Risk Scores:    For questions  or updates, please contact [facility] Please consult [email or web address] for contact info under    Signed, Sheilah Pigeon, PA-C  03/08/2023 9:13 AM   Complete heart block  Coronary artery disease with significant left main disease chosen to be treated medically  Hypertension   Patient has complete heart block.  Pacing is indicated.  Reluctant because of age.  Spoke with patient and daughters and strongly recommended (unusually strongly for me) pacing it is important for the quality of her life which is estimated on longevity tables of about 7+ years.  It helps to reduce the risk of falls and  consequential injury.  She is reluctantly "you always talk me into it but I need a little bit more time "we will plan for Monday.  Have reviewed risks and benefits.  Discontinue the atenolol, even by now it shjould be out of the system

## 2023-03-08 NOTE — Progress Notes (Signed)
Heart Failure Navigator Progress Note  Assessed for Heart & Vascular TOC clinic readiness.  Patient does not meet criteria due to EF 55 %, per MD note history of dementia. .   Navigator will sign off at this time.   Rhae Hammock, BSN, Scientist, clinical (histocompatibility and immunogenetics) Only

## 2023-03-08 NOTE — Progress Notes (Signed)
Rounding Note    Patient Name: Jasmine Crawford Date of Encounter: 03/08/2023  [facility] Cardiologist: Dietrich Pates, MD   Subjective   Pt appears comfortable in bed  No Complaints  Inpatient Medications    Scheduled Meds:  Chlorhexidine Gluconate Cloth  6 each Topical Q0600   feeding supplement  237 mL Oral BID BM   sodium chloride flush  3 mL Intravenous Q12H   Continuous Infusions:  sodium chloride     PRN Meds: sodium chloride, acetaminophen, ondansetron (ZOFRAN) IV, sodium chloride flush   Vital Signs    Vitals:   03/08/23 0530 03/08/23 0600 03/08/23 0630 03/08/23 0700  BP: 127/63 133/60 (!) 144/62 138/60  Pulse: (!) 42 (!) 41 (!) 39 (!) 40  Resp: 16 15 10  (!) 9  Temp:      TempSrc:      SpO2: 97% 97% 95% 97%  Weight:      Height:        Intake/Output Summary (Last 24 hours) at 03/08/2023 0813 Last data filed at 03/07/2023 2200 Gross per 24 hour  Intake --  Output 410 ml  Net -410 ml      03/08/2023    1:00 AM 03/08/2023   12:15 AM 06/05/2022   11:37 AM  Last 3 Weights  Weight (lbs) 153 lb 153 lb 177 lb  Weight (kg) 69.4 kg 69.4 kg 80.287 kg      Telemetry    Complete HB    - Personally Reviewed  ECG     No new - Personally Reviewed  Physical Exam   GEN: No acute distress.   Neck: JVP is nromal  Cardiac: RRR, no murmur=  Respiratory: Clear to auscultation anteriorly GI: Soft, nontender, non-distended  MS: No edema; No deformity.   Labs    High Sensitivity Troponin:  No results for input(s): "TROPONINIHS" in the last 720 hours.   Chemistry Recent Labs  Lab 03/07/23 1655 03/08/23 0550  NA 137 138  K 3.7 3.3*  CL 100 103  CO2 26 25  GLUCOSE 204* 124*  BUN 12 14  CREATININE 1.06* 0.84  CALCIUM 10.3 9.8  MG 1.8  --   PROT 7.3  --   ALBUMIN 3.8  --   AST 25  --   ALT 18  --   ALKPHOS 48  --   BILITOT 1.9*  --   GFRNONAA 52* >60  ANIONGAP 11 10    Lipids No results for input(s): "CHOL", "TRIG", "HDL", "LABVLDL",  "LDLCALC", "CHOLHDL" in the last 168 hours.  Hematology Recent Labs  Lab 03/07/23 1655  WBC 9.2  RBC 5.28*  HGB 16.9*  HCT 49.4*  MCV 93.6  MCH 32.0  MCHC 34.2  RDW 14.6  PLT 198   Thyroid  Recent Labs  Lab 03/07/23 1655  TSH 3.284    BNPNo results for input(s): "BNP", "PROBNP" in the last 168 hours.  DDimer No results for input(s): "DDIMER" in the last 168 hours.   Radiology    DG Chest Port 1 View  Result Date: 03/07/2023 CLINICAL DATA:  Syncope EXAM: PORTABLE CHEST 1 VIEW COMPARISON:  X-ray 01/02/2022 FINDINGS: No consolidation, pneumothorax or effusion. No edema. Normal cardiopericardial silhouette. Calcified aorta. Overlapping cardiac leads and defibrillator pads. The extreme lung apices are clipped off the edge of the film. IMPRESSION: No acute cardiopulmonary disease. Electronically Signed   By: Karen Kays M.D.   On: 03/07/2023 17:39    Cardiac Studies   Echo ordered  CCTA   Apirl 2023   1. Coronary calcium score of 248. This was 63rd percentile for age-, sex, and race-matched controls.   2. Normal coronary origin with right dominance.   3. 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   4. Moderate LCX stenosis (50-69%).   5. Mild CAD (25-49%) in the LAD/RCA.   FFR   1. Left Main: 0.94; low likelihood of hemodynamic significance.   2. Prox LAD: 0.86; low likelihood of hemodynamic significance. 3. Mid LAD: 0.83; low likelihood of hemodynamic significance. 4. Prox LCX: 0.87; low likelihood of hemodynamic significance. 5. Distal LCX: 0.80; low likelihood of hemodynamic significance. 6. RCA: 0.88; low likelihood of hemodynamic significance.   IMPRESSION:   1. CT FFR negative. CCTA concerning for left main disease >50%. Would recommend cardiac cath +/- IVUS for clarification.  Patient Profile     82 y.o. female  with CAD  who is being seen 03/07/2023 for the evaluation of complete heart block and syncope   Assessment & Plan    1  Rhythm.  Patient  presented to ER after syncopal spell yesterday   EKG/tele with complete heart block   Ventricular rates in 40s   BP OK in ER Has been in ICU  for monitoring   Ventricular rates remain 40s/50s OFf meds for 2 days  BP good while in bed I have asked EP to see pt today       2  CAD   CCTA in April 2023 showed significant dz of LM but FFR neg   Patient and family declined intervention / invasive evaluation.   She has done well since  No CP    Echo today to assess LVEF   3 HTN  BP is good     4  Dementia. Pt pleasant, communicates OK  She lives with daughter      Daughter will be here soon    5  PT is DNR   She does not want intubation, CPR    For questions or updates, please contact  [facility] Please consult [email or web address] for contact info under        Signed, Dietrich Pates, MD  03/08/2023, 8:13 AM

## 2023-03-08 NOTE — Plan of Care (Signed)
  Problem: Education: Goal: Knowledge of General Education information will improve Description: Including pain rating scale, medication(s)/side effects and non-pharmacologic comfort measures Outcome: Not Progressing   Problem: Health Behavior/Discharge Planning: Goal: Ability to manage health-related needs will improve Outcome: Not Progressing   Problem: Clinical Measurements: Goal: Ability to maintain clinical measurements within normal limits will improve Outcome: Not Progressing Goal: Will remain free from infection Outcome: Progressing Goal: Diagnostic test results will improve Outcome: Not Progressing Goal: Respiratory complications will improve Outcome: Progressing Goal: Cardiovascular complication will be avoided Outcome: Not Progressing   Problem: Activity: Goal: Risk for activity intolerance will decrease Outcome: Not Progressing   Problem: Nutrition: Goal: Adequate nutrition will be maintained Outcome: Progressing   Problem: Coping: Goal: Level of anxiety will decrease Outcome: Progressing   Problem: Elimination: Goal: Will not experience complications related to bowel motility Outcome: Progressing Goal: Will not experience complications related to urinary retention Outcome: Progressing   Problem: Pain Managment: Goal: General experience of comfort will improve Outcome: Progressing   Problem: Safety: Goal: Ability to remain free from injury will improve Outcome: Not Progressing   Problem: Skin Integrity: Goal: Risk for impaired skin integrity will decrease Outcome: Not Progressing   Problem: Education: Goal: Ability to demonstrate management of disease process will improve Outcome: Not Progressing Goal: Ability to verbalize understanding of medication therapies will improve Outcome: Not Progressing Goal: Individualized Educational Video(s) Outcome: Not Progressing   Problem: Activity: Goal: Capacity to carry out activities will improve Outcome:  Not Progressing   Problem: Cardiac: Goal: Ability to achieve and maintain adequate cardiopulmonary perfusion will improve Outcome: Not Progressing Patient admitted for complete heart block syncope episodes

## 2023-03-09 DIAGNOSIS — I442 Atrioventricular block, complete: Secondary | ICD-10-CM | POA: Diagnosis not present

## 2023-03-09 LAB — HEMOGLOBIN A1C
Hgb A1c MFr Bld: 6 % — ABNORMAL HIGH (ref 4.8–5.6)
Mean Plasma Glucose: 125.5 mg/dL

## 2023-03-09 LAB — BASIC METABOLIC PANEL
Anion gap: 12 (ref 5–15)
BUN: 10 mg/dL (ref 8–23)
CO2: 25 mmol/L (ref 22–32)
Calcium: 9.7 mg/dL (ref 8.9–10.3)
Chloride: 99 mmol/L (ref 98–111)
Creatinine, Ser: 0.91 mg/dL (ref 0.44–1.00)
GFR, Estimated: >60 mL/min (ref >60)
Glucose, Bld: 115 mg/dL — ABNORMAL HIGH (ref 70–99)
Potassium: 3.5 mmol/L (ref 3.5–5.1)
Sodium: 136 mmol/L (ref 135–145)

## 2023-03-09 LAB — GLUCOSE, CAPILLARY
Glucose-Capillary: 133 mg/dL — ABNORMAL HIGH (ref 70–99)
Glucose-Capillary: 106 mg/dL — ABNORMAL HIGH (ref 70–99)

## 2023-03-09 MED ORDER — INSULIN ASPART 100 UNIT/ML IJ SOLN
0.0000 [IU] | Freq: Three times a day (TID) | INTRAMUSCULAR | Status: DC
  Administered 2023-03-10: 2 [IU] via SUBCUTANEOUS
  Administered 2023-03-10 – 2023-03-11 (×2): 1 [IU] via SUBCUTANEOUS
  Administered 2023-03-11: 3 [IU] via SUBCUTANEOUS
  Administered 2023-03-12: 2 [IU] via SUBCUTANEOUS
  Administered 2023-03-12: 1 [IU] via SUBCUTANEOUS
  Administered 2023-03-12: 2 [IU] via SUBCUTANEOUS
  Administered 2023-03-13: 3 [IU] via SUBCUTANEOUS
  Administered 2023-03-13: 1 [IU] via SUBCUTANEOUS

## 2023-03-09 MED ORDER — POTASSIUM CHLORIDE CRYS ER 20 MEQ PO TBCR
40.0000 meq | EXTENDED_RELEASE_TABLET | Freq: Once | ORAL | Status: AC
  Administered 2023-03-09: 40 meq via ORAL
  Filled 2023-03-09: qty 2

## 2023-03-09 NOTE — Progress Notes (Signed)
Progress Note  Patient Name: Jasmine Crawford Date of Encounter: 03/09/2023  Primary Cardiologist: Dietrich Pates, MD   Subjective   No chest pain or shortness of breath  Inpatient Medications    Scheduled Meds:  Chlorhexidine Gluconate Cloth  6 each Topical Q0600   feeding supplement  237 mL Oral BID BM   sodium chloride flush  3 mL Intravenous Q12H   sodium chloride flush  3 mL Intravenous Q12H   Continuous Infusions:  sodium chloride     PRN Meds: sodium chloride, acetaminophen, ondansetron (ZOFRAN) IV, mouth rinse, sodium chloride flush   Vital Signs    Vitals:   03/09/23 0800 03/09/23 0815 03/09/23 0900 03/09/23 1000  BP: (!) 136/52  (!) 129/94 (!) 140/54  Pulse: (!) 35  (!) 40 (!) 39  Resp: 16  13 18   Temp:  97.7 F (36.5 C)    TempSrc:  Oral    SpO2: 95%  99% 100%  Weight:      Height:        Intake/Output Summary (Last 24 hours) at 03/09/2023 1019 Last data filed at 03/09/2023 0900 Gross per 24 hour  Intake 478 ml  Output 400 ml  Net 78 ml   Filed Weights   03/08/23 0015 03/08/23 0100 03/09/23 0500  Weight: 69.4 kg 69.4 kg 69.5 kg    Telemetry    Normal sinus rhythm with complete heart block- Personally Reviewed  ECG    Normal sinus rhythm with complete heart block- Personally Reviewed  Physical Exam   GEN: No acute distress.   Neck: No JVD Cardiac: Regular bradycardia, no murmurs, rubs, or gallops.  Respiratory: Clear to auscultation bilaterally. GI: Soft, nontender, non-distended  MS: No edema; No deformity. Neuro:  Nonfocal  Psych: Normal affect   Labs    Chemistry Recent Labs  Lab 03/07/23 1655 03/08/23 0550 03/09/23 0045  NA 137 138 136  K 3.7 3.3* 3.5  CL 100 103 99  CO2 26 25 25   GLUCOSE 204* 124* 115*  BUN 12 14 10   CREATININE 1.06* 0.84 0.91  CALCIUM 10.3 9.8 9.7  PROT 7.3  --   --   ALBUMIN 3.8  --   --   AST 25  --   --   ALT 18  --   --   ALKPHOS 48  --   --   BILITOT 1.9*  --   --   GFRNONAA 52* >60 >60   ANIONGAP 11 10 12      Hematology Recent Labs  Lab 03/07/23 1655  WBC 9.2  RBC 5.28*  HGB 16.9*  HCT 49.4*  MCV 93.6  MCH 32.0  MCHC 34.2  RDW 14.6  PLT 198    Cardiac EnzymesNo results for input(s): "TROPONINI" in the last 168 hours. No results for input(s): "TROPIPOC" in the last 168 hours.   BNPNo results for input(s): "BNP", "PROBNP" in the last 168 hours.   DDimer No results for input(s): "DDIMER" in the last 168 hours.   Radiology    ECHOCARDIOGRAM COMPLETE  Result Date: 03/08/2023    ECHOCARDIOGRAM REPORT   Patient Name:   Jasmine Crawford Date of Exam: 03/08/2023 Medical Rec #:  [id number]    Height:       64.0 in Accession #:    [id number]   Weight:       153.0 lb Date of Birth:  [birth year]    BSA:          1.746 m Patient Age:  81 years     BP:           160/61 mmHg Patient Gender: F            HR:           41 bpm. Exam Location:  Inpatient Procedure: 2D Echo, Cardiac Doppler and Color Doppler Indications:    Syncope  History:        Patient has prior history of Echocardiogram examinations, most                 recent 01/01/2022. CAD, PAD, Arrythmias:complete heart block and                 Bradycardia, Signs/Symptoms:Chest Pain,                 Dizziness/Lightheadedness and Syncope; Risk                 Factors:Hypertension, Diabetes and Dyslipidemia.  Sonographer:    Wallie Char Referring Phys: [id number] PAULA V ROSS  Sonographer Comments: Technically difficult study due to poor echo windows. IMPRESSIONS  1. Left ventricular ejection fraction, by estimation, is 55%. The left ventricle has normal function. The left ventricle has no regional wall motion abnormalities. Left ventricular diastolic parameters are indeterminate.  2. Right ventricular systolic function is moderately reduced. The right ventricular size is moderately enlarged.  3. Left atrial size was moderately dilated.  4. Right atrial size was moderately dilated.  5. The mitral valve is abnormal. Mild mitral valve  regurgitation. No evidence of mitral stenosis. Moderate mitral annular calcification.  6. The aortic valve is tricuspid. There is moderate calcification of the aortic valve. There is moderate thickening of the aortic valve. Aortic valve regurgitation is not visualized. Aortic valve sclerosis is present, with no evidence of aortic valve stenosis.  7. The inferior vena cava is normal in size with greater than 50% respiratory variability, suggesting right atrial pressure of 3 mmHg. FINDINGS  Left Ventricle: Left ventricular ejection fraction, by estimation, is 55%. The left ventricle has normal function. The left ventricle has no regional wall motion abnormalities. The left ventricular internal cavity size was normal in size. There is no left ventricular hypertrophy. Left ventricular diastolic parameters are indeterminate. Right Ventricle: The right ventricular size is moderately enlarged. No increase in right ventricular wall thickness. Right ventricular systolic function is moderately reduced. Left Atrium: Left atrial size was moderately dilated. Right Atrium: Right atrial size was moderately dilated. Pericardium: There is no evidence of pericardial effusion. Mitral Valve: The mitral valve is abnormal. There is moderate thickening of the mitral valve leaflet(s). There is moderate calcification of the mitral valve leaflet(s). Moderate mitral annular calcification. Mild mitral valve regurgitation. No evidence of mitral valve stenosis. MV peak gradient, 3.6 mmHg. The mean mitral valve gradient is 1.5 mmHg. Tricuspid Valve: The tricuspid valve is normal in structure. Tricuspid valve regurgitation is mild . No evidence of tricuspid stenosis. Aortic Valve: The aortic valve is tricuspid. There is moderate calcification of the aortic valve. There is moderate thickening of the aortic valve. Aortic valve regurgitation is not visualized. Aortic valve sclerosis is present, with no evidence of aortic valve stenosis. Aortic valve  mean gradient measures 5.5 mmHg. Aortic valve peak gradient measures 11.0 mmHg. Aortic valve area, by VTI measures 2.11 cm. Pulmonic Valve: The pulmonic valve was normal in structure. Pulmonic valve regurgitation is not visualized. No evidence of pulmonic stenosis. Aorta: The aortic root is normal in size and structure. Venous: The  inferior vena cava is normal in size with greater than 50% respiratory variability, suggesting right atrial pressure of 3 mmHg. IAS/Shunts: No atrial level shunt detected by color flow Doppler.  LEFT VENTRICLE PLAX 2D LVIDd:         4.00 cm     Diastology LVIDs:         2.80 cm     LV e' medial:    9.92 cm/s LV PW:         1.00 cm     LV E/e' medial:  7.9 LV IVS:        0.80 cm     LV e' lateral:   8.87 cm/s LVOT diam:     1.90 cm     LV E/e' lateral: 8.8 LV SV:         89 LV SV Index:   51 LVOT Area:     2.84 cm  LV Volumes (MOD) LV vol d, MOD A2C: 65.1 ml LV vol d, MOD A4C: 75.4 ml LV vol s, MOD A2C: 27.2 ml LV vol s, MOD A4C: 33.6 ml LV SV MOD A2C:     37.9 ml LV SV MOD A4C:     75.4 ml LV SV MOD BP:      38.8 ml RIGHT VENTRICLE            IVC RV Basal diam:  4.30 cm    IVC diam: 1.50 cm RV S prime:     9.28 cm/s TAPSE (M-mode): 2.1 cm LEFT ATRIUM             Index        RIGHT ATRIUM           Index LA diam:        4.50 cm 2.58 cm/m   RA Area:     18.40 cm LA Vol (A2C):   62.7 ml 35.91 ml/m  RA Volume:   49.30 ml  28.24 ml/m LA Vol (A4C):   71.6 ml 41.01 ml/m LA Biplane Vol: 67.3 ml 38.55 ml/m  AORTIC VALVE AV Area (Vmax):    2.23 cm AV Area (Vmean):   2.21 cm AV Area (VTI):     2.11 cm AV Vmax:           165.50 cm/s AV Vmean:          110.500 cm/s AV VTI:            0.424 m AV Peak Grad:      11.0 mmHg AV Mean Grad:      5.5 mmHg LVOT Vmax:         130.00 cm/s LVOT Vmean:        86.150 cm/s LVOT VTI:          0.315 m LVOT/AV VTI ratio: 0.74  AORTA Ao Root diam: 3.30 cm MITRAL VALVE               TRICUSPID VALVE MV Area (PHT): 2.33 cm    TR Peak grad:   39.7 mmHg MV Area  VTI:   2.80 cm    TR Vmax:        315.00 cm/s MV Peak grad:  3.6 mmHg MV Mean grad:  1.5 mmHg    SHUNTS MV Vmax:       0.95 m/s    Systemic VTI:  0.32 m MV Vmean:      54.6 cm/s   Systemic Diam: 1.90 cm MV Decel Time: 326 msec MV  E velocity: 78.33 cm/s MV A velocity: 80.17 cm/s MV E/A ratio:  0.98 Charlton Haws MD Electronically signed by Charlton Haws MD Signature Date/Time: 03/08/2023/10:07:19 AM    Final    DG Chest Port 1 View  Result Date: 03/07/2023 CLINICAL DATA:  Syncope EXAM: PORTABLE CHEST 1 VIEW COMPARISON:  X-ray 01/02/2022 FINDINGS: No consolidation, pneumothorax or effusion. No edema. Normal cardiopericardial silhouette. Calcified aorta. Overlapping cardiac leads and defibrillator pads. The extreme lung apices are clipped off the edge of the film. IMPRESSION: No acute cardiopulmonary disease. Electronically Signed   By: Karen Kays M.D.   On: 03/07/2023 17:39    Cardiac Studies   2D echo reviewed  Patient Profile     82 y.o. female admitted with Stokes-Adams attacks and found to have complete heart block  Assessment & Plan    1.  Complete heart block -I discussed the treatment options with the patient.  The risk, goals, benefits, and expectations of dual-chamber pacemaker insertion were reviewed the patient.  Will plan pacemaker insertion either tomorrow or Monday.  For questions or updates, please contact [facility] Please consult [email or web address] for contact info under Cardiology/STEMI.      Signed, Lewayne Bunting, MD  03/09/2023, 10:19 AM

## 2023-03-10 ENCOUNTER — Inpatient Hospital Stay (HOSPITAL_COMMUNITY)
Admission: EM | Disposition: A | Discharge status: Skilled Nursing Facility | Payer: Self-pay | Source: Home / Self Care | Attending: Internal Medicine

## 2023-03-10 DIAGNOSIS — Z7982 Long term (current) use of aspirin: Secondary | ICD-10-CM | POA: Diagnosis not present

## 2023-03-10 DIAGNOSIS — Z5941 Food insecurity: Secondary | ICD-10-CM | POA: Diagnosis not present

## 2023-03-10 DIAGNOSIS — E039 Hypothyroidism, unspecified: Secondary | ICD-10-CM | POA: Diagnosis not present

## 2023-03-10 DIAGNOSIS — Z888 Allergy status to other drugs, medicaments and biological substances status: Secondary | ICD-10-CM | POA: Diagnosis not present

## 2023-03-10 DIAGNOSIS — I442 Atrioventricular block, complete: Secondary | ICD-10-CM | POA: Diagnosis not present

## 2023-03-10 DIAGNOSIS — Z881 Allergy status to other antibiotic agents status: Secondary | ICD-10-CM | POA: Diagnosis not present

## 2023-03-10 DIAGNOSIS — Z66 Do not resuscitate: Secondary | ICD-10-CM | POA: Diagnosis not present

## 2023-03-10 DIAGNOSIS — I10 Essential (primary) hypertension: Secondary | ICD-10-CM | POA: Diagnosis not present

## 2023-03-10 DIAGNOSIS — F03A0 Unspecified dementia, mild, without behavioral disturbance, psychotic disturbance, mood disturbance, and anxiety: Secondary | ICD-10-CM | POA: Diagnosis not present

## 2023-03-10 DIAGNOSIS — E1151 Type 2 diabetes mellitus with diabetic peripheral angiopathy without gangrene: Secondary | ICD-10-CM | POA: Diagnosis not present

## 2023-03-10 DIAGNOSIS — Z7984 Long term (current) use of oral hypoglycemic drugs: Secondary | ICD-10-CM | POA: Diagnosis not present

## 2023-03-10 DIAGNOSIS — H5462 Unqualified visual loss, left eye, normal vision right eye: Secondary | ICD-10-CM | POA: Diagnosis not present

## 2023-03-10 DIAGNOSIS — E785 Hyperlipidemia, unspecified: Secondary | ICD-10-CM | POA: Diagnosis not present

## 2023-03-10 DIAGNOSIS — Z79899 Other long term (current) drug therapy: Secondary | ICD-10-CM | POA: Diagnosis not present

## 2023-03-10 DIAGNOSIS — I2510 Atherosclerotic heart disease of native coronary artery without angina pectoris: Secondary | ICD-10-CM | POA: Diagnosis not present

## 2023-03-10 HISTORY — PX: PACEMAKER IMPLANT: EP1218

## 2023-03-10 LAB — GLUCOSE, CAPILLARY
Glucose-Capillary: 141 mg/dL — ABNORMAL HIGH (ref 70–99)
Glucose-Capillary: 148 mg/dL — ABNORMAL HIGH (ref 70–99)
Glucose-Capillary: 108 mg/dL — ABNORMAL HIGH (ref 70–99)
Glucose-Capillary: 162 mg/dL — ABNORMAL HIGH (ref 70–99)

## 2023-03-10 SURGERY — PACEMAKER IMPLANT
Anesthesia: LOCAL

## 2023-03-10 MED ORDER — CEFAZOLIN SODIUM-DEXTROSE 2-4 GM/100ML-% IV SOLN
2.0000 g | INTRAVENOUS | Status: AC
  Administered 2023-03-10: 2 g via INTRAVENOUS

## 2023-03-10 MED ORDER — MIDAZOLAM HCL 5 MG/5ML IJ SOLN
INTRAMUSCULAR | Status: DC | PRN
  Administered 2023-03-10: 2 mg via INTRAVENOUS

## 2023-03-10 MED ORDER — MIDAZOLAM HCL 2 MG/2ML IJ SOLN
INTRAMUSCULAR | Status: AC
  Filled 2023-03-10: qty 2

## 2023-03-10 MED ORDER — SODIUM CHLORIDE 0.9 % IV SOLN: INTRAVENOUS | Status: DC

## 2023-03-10 MED ORDER — ACETAMINOPHEN 325 MG PO TABS: 325.0000 mg | ORAL_TABLET | ORAL | Status: DC | PRN

## 2023-03-10 MED ORDER — CEFAZOLIN SODIUM-DEXTROSE 1-4 GM/50ML-% IV SOLN
1.0000 g | Freq: Four times a day (QID) | INTRAVENOUS | Status: AC
  Administered 2023-03-10 – 2023-03-11 (×3): 1 g via INTRAVENOUS
  Filled 2023-03-10 (×3): qty 50

## 2023-03-10 MED ORDER — CHLORHEXIDINE GLUCONATE CLOTH 2 % EX PADS
6.0000 | MEDICATED_PAD | Freq: Every day | CUTANEOUS | Status: DC
  Administered 2023-03-11: 6 via TOPICAL

## 2023-03-10 MED ORDER — ONDANSETRON HCL 4 MG/2ML IJ SOLN: 4.0000 mg | Freq: Four times a day (QID) | INTRAMUSCULAR | Status: DC | PRN

## 2023-03-10 MED ORDER — HEPARIN (PORCINE) IN NACL 1000-0.9 UT/500ML-% IV SOLN
INTRAVENOUS | Status: DC | PRN
  Administered 2023-03-10: 500 mL

## 2023-03-10 MED ORDER — SODIUM CHLORIDE 0.9 % IV SOLN: 250.0000 mL | INTRAVENOUS | Status: DC

## 2023-03-10 MED ORDER — LIDOCAINE HCL (PF) 1 % IJ SOLN
INTRAMUSCULAR | Status: DC | PRN
  Administered 2023-03-10: 60 mL

## 2023-03-10 MED ORDER — LIDOCAINE HCL (PF) 1 % IJ SOLN
INTRAMUSCULAR | Status: AC
  Filled 2023-03-10: qty 60

## 2023-03-10 MED ORDER — SODIUM CHLORIDE 0.9 % IV SOLN
INTRAVENOUS | Status: AC
  Filled 2023-03-10: qty 2

## 2023-03-10 MED ORDER — CEFAZOLIN SODIUM-DEXTROSE 2-4 GM/100ML-% IV SOLN
INTRAVENOUS | Status: AC
  Filled 2023-03-10: qty 100

## 2023-03-10 MED ORDER — SODIUM CHLORIDE 0.9% FLUSH: 3.0000 mL | INTRAVENOUS | Status: DC | PRN

## 2023-03-10 MED ORDER — SODIUM CHLORIDE 0.9 % IV SOLN
80.0000 mg | INTRAVENOUS | Status: AC
  Administered 2023-03-10: 80 mg
  Filled 2023-03-10: qty 2

## 2023-03-10 MED ORDER — FENTANYL CITRATE (PF) 100 MCG/2ML IJ SOLN
INTRAMUSCULAR | Status: AC
  Filled 2023-03-10: qty 2

## 2023-03-10 SURGICAL SUPPLY — 11 items
CABLE SURGICAL S-101-97-12 (CABLE) ×1 IMPLANT
CATH CPS LOCATOR 3D MED (CATHETERS) IMPLANT
GUIDEWIRE VASC J-TIP .035X150 (WIRE) IMPLANT
LEAD ULTIPACE 52 LPA1231/52 (Lead) IMPLANT
LEAD ULTIPACE 65 LPA1231/65 (Lead) IMPLANT
MAT PREVALON FULL STRYKER (MISCELLANEOUS) IMPLANT
PACEMAKER ASSURITY DR-RF (Pacemaker) IMPLANT
PAD DEFIB RADIO PHYSIO CONN (PAD) ×1 IMPLANT
SHEATH 7FR PRELUDE SNAP 13 (SHEATH) IMPLANT
SHEATH 9FR PRELUDE SNAP 13 (SHEATH) IMPLANT
TRAY PACEMAKER INSERTION (PACKS) ×1 IMPLANT

## 2023-03-10 NOTE — Progress Notes (Signed)
Progress Note  Patient Name: Jasmine Crawford Date of Encounter: 03/10/2023  Primary Cardiologist: Dietrich Pates, MD   Subjective   none  Inpatient Medications    Scheduled Meds:  [START ON 03/11/2023] Chlorhexidine Gluconate Cloth  6 each Topical Daily   feeding supplement  237 mL Oral BID BM   insulin aspart  0-9 Units Subcutaneous TID WC   sodium chloride flush  3 mL Intravenous Q12H   sodium chloride flush  3 mL Intravenous Q12H   Continuous Infusions:  sodium chloride     PRN Meds: sodium chloride, acetaminophen, ondansetron (ZOFRAN) IV, mouth rinse, sodium chloride flush   Vital Signs    Vitals:   03/10/23 0500 03/10/23 0600 03/10/23 0700 03/10/23 0800  BP: (!) 140/68 (!) 141/63 (!) 147/62 (!) 153/66  Pulse: (!) 40 (!) 37 (!) 39 (!) 39  Resp: 19 19 13 16   Temp:   97.8 F (36.6 C)   TempSrc:   Oral   SpO2: 96% 97% 100% 100%  Weight:      Height:        Intake/Output Summary (Last 24 hours) at 03/10/2023 0819 Last data filed at 03/10/2023 0450 Gross per 24 hour  Intake 598 ml  Output 750 ml  Net -152 ml   Filed Weights   03/08/23 0015 03/08/23 0100 03/09/23 0500  Weight: 69.4 kg 69.4 kg 69.5 kg    Telemetry    NSR with [facility] - Personally Reviewed  ECG    none - Personally Reviewed  Physical Exam   GEN: No acute distress.   Neck: No JVD Cardiac: RRR, no murmurs, rubs, or gallops.  Respiratory: Clear to auscultation bilaterally. GI: Soft, nontender, non-distended  MS: No edema; No deformity. Neuro:  Nonfocal  Psych: Normal affect   Labs    Chemistry Recent Labs  Lab 03/07/23 1655 03/08/23 0550 03/09/23 0045  NA 137 138 136  K 3.7 3.3* 3.5  CL 100 103 99  CO2 26 25 25   GLUCOSE 204* 124* 115*  BUN 12 14 10   CREATININE 1.06* 0.84 0.91  CALCIUM 10.3 9.8 9.7  PROT 7.3  --   --   ALBUMIN 3.8  --   --   AST 25  --   --   ALT 18  --   --   ALKPHOS 48  --   --   BILITOT 1.9*  --   --   GFRNONAA 52* >60 >60  ANIONGAP 11 10 12       Hematology Recent Labs  Lab 03/07/23 1655  WBC 9.2  RBC 5.28*  HGB 16.9*  HCT 49.4*  MCV 93.6  MCH 32.0  MCHC 34.2  RDW 14.6  PLT 198    Cardiac EnzymesNo results for input(s): "TROPONINI" in the last 168 hours. No results for input(s): "TROPIPOC" in the last 168 hours.   BNPNo results for input(s): "BNP", "PROBNP" in the last 168 hours.   DDimer No results for input(s): "DDIMER" in the last 168 hours.   Radiology    ECHOCARDIOGRAM COMPLETE  Result Date: 03/08/2023    ECHOCARDIOGRAM REPORT   Patient Name:   JURY Crawford Date of Exam: 03/08/2023 Medical Rec #:  [id number]    Height:       64.0 in Accession #:    [id number]   Weight:       153.0 lb Date of Birth:  [birth year]    BSA:          1.746 m Patient Age:  81 years     BP:           160/61 mmHg Patient Gender: F            HR:           41 bpm. Exam Location:  Inpatient Procedure: 2D Echo, Cardiac Doppler and Color Doppler Indications:    Syncope  History:        Patient has prior history of Echocardiogram examinations, most                 recent 01/01/2022. CAD, PAD, Arrythmias:complete heart block and                 Bradycardia, Signs/Symptoms:Chest Pain,                 Dizziness/Lightheadedness and Syncope; Risk                 Factors:Hypertension, Diabetes and Dyslipidemia.  Sonographer:    Wallie Char Referring Phys: [id number] PAULA V ROSS  Sonographer Comments: Technically difficult study due to poor echo windows. IMPRESSIONS  1. Left ventricular ejection fraction, by estimation, is 55%. The left ventricle has normal function. The left ventricle has no regional wall motion abnormalities. Left ventricular diastolic parameters are indeterminate.  2. Right ventricular systolic function is moderately reduced. The right ventricular size is moderately enlarged.  3. Left atrial size was moderately dilated.  4. Right atrial size was moderately dilated.  5. The mitral valve is abnormal. Mild mitral valve regurgitation. No evidence of  mitral stenosis. Moderate mitral annular calcification.  6. The aortic valve is tricuspid. There is moderate calcification of the aortic valve. There is moderate thickening of the aortic valve. Aortic valve regurgitation is not visualized. Aortic valve sclerosis is present, with no evidence of aortic valve stenosis.  7. The inferior vena cava is normal in size with greater than 50% respiratory variability, suggesting right atrial pressure of 3 mmHg. FINDINGS  Left Ventricle: Left ventricular ejection fraction, by estimation, is 55%. The left ventricle has normal function. The left ventricle has no regional wall motion abnormalities. The left ventricular internal cavity size was normal in size. There is no left ventricular hypertrophy. Left ventricular diastolic parameters are indeterminate. Right Ventricle: The right ventricular size is moderately enlarged. No increase in right ventricular wall thickness. Right ventricular systolic function is moderately reduced. Left Atrium: Left atrial size was moderately dilated. Right Atrium: Right atrial size was moderately dilated. Pericardium: There is no evidence of pericardial effusion. Mitral Valve: The mitral valve is abnormal. There is moderate thickening of the mitral valve leaflet(s). There is moderate calcification of the mitral valve leaflet(s). Moderate mitral annular calcification. Mild mitral valve regurgitation. No evidence of mitral valve stenosis. MV peak gradient, 3.6 mmHg. The mean mitral valve gradient is 1.5 mmHg. Tricuspid Valve: The tricuspid valve is normal in structure. Tricuspid valve regurgitation is mild . No evidence of tricuspid stenosis. Aortic Valve: The aortic valve is tricuspid. There is moderate calcification of the aortic valve. There is moderate thickening of the aortic valve. Aortic valve regurgitation is not visualized. Aortic valve sclerosis is present, with no evidence of aortic valve stenosis. Aortic valve mean gradient measures 5.5  mmHg. Aortic valve peak gradient measures 11.0 mmHg. Aortic valve area, by VTI measures 2.11 cm. Pulmonic Valve: The pulmonic valve was normal in structure. Pulmonic valve regurgitation is not visualized. No evidence of pulmonic stenosis. Aorta: The aortic root is normal in size and structure. Venous: The  inferior vena cava is normal in size with greater than 50% respiratory variability, suggesting right atrial pressure of 3 mmHg. IAS/Shunts: No atrial level shunt detected by color flow Doppler.  LEFT VENTRICLE PLAX 2D LVIDd:         4.00 cm     Diastology LVIDs:         2.80 cm     LV e' medial:    9.92 cm/s LV PW:         1.00 cm     LV E/e' medial:  7.9 LV IVS:        0.80 cm     LV e' lateral:   8.87 cm/s LVOT diam:     1.90 cm     LV E/e' lateral: 8.8 LV SV:         89 LV SV Index:   51 LVOT Area:     2.84 cm  LV Volumes (MOD) LV vol d, MOD A2C: 65.1 ml LV vol d, MOD A4C: 75.4 ml LV vol s, MOD A2C: 27.2 ml LV vol s, MOD A4C: 33.6 ml LV SV MOD A2C:     37.9 ml LV SV MOD A4C:     75.4 ml LV SV MOD BP:      38.8 ml RIGHT VENTRICLE            IVC RV Basal diam:  4.30 cm    IVC diam: 1.50 cm RV S prime:     9.28 cm/s TAPSE (M-mode): 2.1 cm LEFT ATRIUM             Index        RIGHT ATRIUM           Index LA diam:        4.50 cm 2.58 cm/m   RA Area:     18.40 cm LA Vol (A2C):   62.7 ml 35.91 ml/m  RA Volume:   49.30 ml  28.24 ml/m LA Vol (A4C):   71.6 ml 41.01 ml/m LA Biplane Vol: 67.3 ml 38.55 ml/m  AORTIC VALVE AV Area (Vmax):    2.23 cm AV Area (Vmean):   2.21 cm AV Area (VTI):     2.11 cm AV Vmax:           165.50 cm/s AV Vmean:          110.500 cm/s AV VTI:            0.424 m AV Peak Grad:      11.0 mmHg AV Mean Grad:      5.5 mmHg LVOT Vmax:         130.00 cm/s LVOT Vmean:        86.150 cm/s LVOT VTI:          0.315 m LVOT/AV VTI ratio: 0.74  AORTA Ao Root diam: 3.30 cm MITRAL VALVE               TRICUSPID VALVE MV Area (PHT): 2.33 cm    TR Peak grad:   39.7 mmHg MV Area VTI:   2.80 cm    TR Vmax:         315.00 cm/s MV Peak grad:  3.6 mmHg MV Mean grad:  1.5 mmHg    SHUNTS MV Vmax:       0.95 m/s    Systemic VTI:  0.32 m MV Vmean:      54.6 cm/s   Systemic Diam: 1.90 cm MV Decel Time: 326 msec MV  E velocity: 78.33 cm/s MV A velocity: 80.17 cm/s MV E/A ratio:  0.98 Charlton Haws MD Electronically signed by Charlton Haws MD Signature Date/Time: 03/08/2023/10:07:19 AM    Final     Cardiac Studies   See above  Patient Profile     82 y.o. female admitted with CHB  Assessment & Plan    [facility] - she has an escape in the 30s. I will try to place PPM later today, if staff available.   For questions or updates, please contact [facility] Please consult [email or web address] for contact info under Cardiology/STEMI.      Signed, Lewayne Bunting, MD  03/10/2023, 8:19 AM

## 2023-03-10 NOTE — Discharge Instructions (Signed)
After Your Pacemaker   You have a [employer] Pacemaker  ACTIVITY Do not lift your arm above shoulder height for 1 week after your procedure. After 7 days, you may progress as below.  You should remove your sling 24 hours after your procedure, unless otherwise instructed by your provider.     Sunday March 17, 2023  Monday March 18, 2023 Tuesday March 19, 2023 Wednesday March 20, 2023   Do not lift, push, pull, or carry anything over 10 pounds with the affected arm until 6 weeks (Sunday April 21, 2023 ) after your procedure.   You may drive AFTER your wound check, unless you have been told otherwise by your provider.   Ask your healthcare provider when you can go back to work   INCISION/Dressing If you are on a blood thinner such as Coumadin, Xarelto, Eliquis, Plavix, or Pradaxa please confirm with your provider when this should be resumed.   If large square, outer bandage is left in place, this can be removed after 24 hours from your procedure. Do not remove steri-strips or glue as below.   If a PRESSURE DRESSING (a bulky dressing that usually goes up over your shoulder) was applied or left in place, please follow instructions given by your provider on when to return to have this removed.   Monitor your Pacemaker site for redness, swelling, and drainage. Call the device clinic at [phone] if you experience these symptoms or fever/chills.  If your incision is sealed with Steri-strips or staples, you may shower 7 days after your procedure or when told by your provider. Do not remove the steri-strips or let the shower hit directly on your site. You may wash around your site with soap and water.    If you were discharged in a sling, please do not wear this during the day more than 48 hours after your surgery unless otherwise instructed. This may increase the risk of stiffness and soreness in your shoulder.   Avoid lotions, ointments, or perfumes over your incision until it is  well-healed.  You may use a hot tub or a pool AFTER your wound check appointment if the incision is completely closed.  Pacemaker Alerts:  Some alerts are vibratory and others beep. These are NOT emergencies. Please call our office to let us know. If this occurs at night or on weekends, it can wait until the next business day. Send a remote transmission.  If your device is capable of reading fluid status (for heart failure), you will be offered monthly monitoring to review this with you.   DEVICE MANAGEMENT Remote monitoring is used to monitor your pacemaker from home. This monitoring is scheduled every 91 days by our office. It allows [place] to keep an eye on the functioning of your device to ensure it is working properly. You will routinely see your Electrophysiologist annually (more often if necessary).   You should receive your ID card for your new device in 4-8 weeks. Keep this card with you at all times once received. Consider wearing a medical alert bracelet or necklace.  Your Pacemaker may be MRI compatible. This will be discussed at your next office visit/wound check.  You should avoid contact with strong electric or magnetic fields.   Do not use amateur (ham) radio equipment or electric (arc) welding torches. MP3 player headphones with magnets should not be used. Some devices are safe to use if held at least 12 inches (30 cm) from your Pacemaker. These include power tools, lawn  mowers, and speakers. If you are unsure if something is safe to use, ask your health care provider.  When using your cell phone, hold it to the ear that is on the opposite side from the Pacemaker. Do not leave your cell phone in a pocket over the Pacemaker.  You may safely use electric blankets, heating pads, computers, and microwave ovens.  Call the office right away if: You have chest pain. You feel more short of breath than you have felt before. You feel more light-headed than you have felt before. Your  incision starts to open up.  This information is not intended to replace advice given to you by your health care provider. Make sure you discuss any questions you have with your health care provider.

## 2023-03-11 ENCOUNTER — Encounter (HOSPITAL_COMMUNITY): Payer: Self-pay | Admitting: Internal Medicine

## 2023-03-11 ENCOUNTER — Inpatient Hospital Stay (HOSPITAL_COMMUNITY): Payer: Medicare HMO

## 2023-03-11 ENCOUNTER — Encounter (HOSPITAL_COMMUNITY)
Admission: EM | Disposition: A | Discharge status: Skilled Nursing Facility | Payer: Self-pay | Source: Home / Self Care | Attending: Internal Medicine

## 2023-03-11 DIAGNOSIS — I442 Atrioventricular block, complete: Secondary | ICD-10-CM | POA: Diagnosis not present

## 2023-03-11 LAB — GLUCOSE, CAPILLARY
Glucose-Capillary: 126 mg/dL — ABNORMAL HIGH (ref 70–99)
Glucose-Capillary: 246 mg/dL — ABNORMAL HIGH (ref 70–99)
Glucose-Capillary: 89 mg/dL (ref 70–99)
Glucose-Capillary: 158 mg/dL — ABNORMAL HIGH (ref 70–99)

## 2023-03-11 SURGERY — PACEMAKER IMPLANT

## 2023-03-11 MED ORDER — ATENOLOL 25 MG PO TABS
12.5000 mg | ORAL_TABLET | Freq: Every day | ORAL | Status: DC
  Administered 2023-03-11 – 2023-03-13 (×3): 12.5 mg via ORAL
  Filled 2023-03-11 (×4): qty 0.5

## 2023-03-11 MED ORDER — AMLODIPINE BESYLATE 2.5 MG PO TABS
2.5000 mg | ORAL_TABLET | Freq: Every day | ORAL | Status: DC
  Administered 2023-03-11 – 2023-03-13 (×3): 2.5 mg via ORAL
  Filled 2023-03-11 (×3): qty 1

## 2023-03-11 MED FILL — Midazolam HCl Inj 2 MG/2ML (Base Equivalent): INTRAMUSCULAR | Qty: 2 | Status: AC

## 2023-03-11 NOTE — Progress Notes (Signed)
Orthopedic Tech Progress Note Patient Details:  Jasmine Crawford 14-Feb-1941 [id number]  Patient ID: [name], female   DOB: [birth year], 82 y.o.   MRN: [id number] I spoke with floor staff. They said patient has arm sling. Trinna Post 03/11/2023, 6:28 AM

## 2023-03-11 NOTE — Progress Notes (Signed)
  Pt noted to have difficutly transitioning from bed to bedside commode.   PT to eval and treat for recommendations.   Casimiro Needle [address]" [place], PA-C  03/11/2023 11:16 AM

## 2023-03-11 NOTE — Progress Notes (Addendum)
  Patient Name: Jasmine Crawford Date of Encounter: 03/11/2023  Primary Cardiologist: Dietrich Pates, MD Electrophysiologist: Dr. Ladona Ridgel  Interval Summary   S/p PPM 6/16 by Dr. Ladona Ridgel.   Had some weakness getting up to the commode from the bed; PT consulted.   Inpatient Medications    Scheduled Meds:  amLODipine  2.5 mg Oral Daily   atenolol  12.5 mg Oral Daily   Chlorhexidine Gluconate Cloth  6 each Topical Daily   feeding supplement  237 mL Oral BID BM   insulin aspart  0-9 Units Subcutaneous TID WC   sodium chloride flush  3 mL Intravenous Q12H   sodium chloride flush  3 mL Intravenous Q12H   Continuous Infusions:  sodium chloride     PRN Meds: sodium chloride, acetaminophen, ondansetron (ZOFRAN) IV, mouth rinse, sodium chloride flush   Vital Signs    Vitals:   03/11/23 0700 03/11/23 0804 03/11/23 0900 03/11/23 1118  BP: (!) 166/92 128/85 135/70 (!) 150/86  Pulse: 73  81   Resp: 13 13 (!) 21   Temp:      TempSrc:      SpO2: 99%  98%   Weight:      Height:        Intake/Output Summary (Last 24 hours) at 03/11/2023 1131 Last data filed at 03/11/2023 0700 Gross per 24 hour  Intake 209.77 ml  Output 1300 ml  Net -1090.23 ml   Filed Weights   03/08/23 0100 03/09/23 0500 03/11/23 0500  Weight: 69.4 kg 69.5 kg 71.2 kg    Physical Exam    GEN- The patient is well appearing, alert and oriented x 3 today.   Lungs- Clear to ausculation bilaterally, normal work of breathing Cardiac- Regular rate and rhythm, no murmurs, rubs or gallops GI- soft, NT, ND, + BS Extremities- no clubbing or cyanosis. No edema  Telemetry    V paced 80-90s (personally reviewed)  Hospital Course    [name] is a 82 y.o. female admitted for CHB. Underwent PPM 6/16.   6/17 am had weakness transitioning. PT recommends SNF vs 24 hr supervision  Assessment & Plan    CHB S/p Abbott PPM 03/10/2023 CXR stable this am Wound care and arm restrictions  Deconditioning PT recommended SNF vs  24 hr care.  Family unable to provide care around the clock.  Will consult CSW/TOC for SNF placement.   Transfer to floor.   For questions or updates, please contact [facility] Please consult [email or web address] for contact info under Cardiology/STEMI.  Signed, Graciella Freer, PA-C  03/11/2023, 11:31 AM   EP Attending  Patient seen and examined. Agree with the findings as noted above. The patient is s/p DDD PM insertion. Interrogation of her device under my direction demonstrates normal DDD PM function. Lungs are clear and CV reveals a RRR. She is still alittle weak and PT will be asked to see. Tele demonstrates nsr with ventricular pacing.  A/P CHB - she is asymptomatic s/p DDD PM insertion. PPM - her St. Jude DDD PM is working normally. Usual followup. 3. Weakness - PT to eval.  [name]

## 2023-03-11 NOTE — Discharge Summary (Cosign Needed)
   ELECTROPHYSIOLOGY PROCEDURE DISCHARGE SUMMARY    Patient ID: Jasmine Crawford,  MRN: [id number], DOB/AGE: 12/27/1940 81 y.o.  Admit date: 03/07/2023 Discharge date: 03/11/2023  Primary Care Physician: King, Louise, MD  Primary Cardiologist: Paula Ross, MD  Electrophysiologist: Dr. Taylor   Primary Discharge Diagnosis:  CHB status post pacemaker implantation this admission  Secondary Discharge Diagnosis:  HTN  Allergies  Allergen Reactions   Bacitracin Swelling and Other (See Comments)    "Swelling," but not swelling of the throat   Erythromycin Base Swelling and Other (See Comments)    "Swelling," but not swelling of the throat   Losartan Other (See Comments)    Reaction not recalled   Ramipril Other (See Comments)    Reaction not recalled      Procedures This Admission:  1.  Implantation of a Abbott Dual Chamber PPM on 03/10/2023 by Dr. Taylor. The patient received a Abbott Assurity PM2272 with a Abbott Ultipace 1231-52 right atrial lead and a Abbott Ultipace 1231-65 right ventricular lead.  There were no immediate post procedure complications.   2.  CXR on 03/11/2023 demonstrated no pneumothorax status post device implantation.       Brief HPI: Jasmine Crawford is a 81 y.o. female was admitted for symptomatic bradycardia in the setting of CHB and electrophysiology team asked to see for consideration of PPM implantation.  Past medical history includes above.  The patient has had AV block without reversible causes identified.  Risks, benefits, and alternatives to PPM implantation were reviewed with the patient who wished to proceed.   Hospital Course:  The patient was admitted and underwent implantation of a Abbott dual chamber PPM with details as outlined above.  She was monitored on telemetry overnight which demonstrated appropriate pacing.  Left chest was without hematoma or ecchymosis.  The device was interrogated and found to be functioning normally.  CXR was obtained  and demonstrated no pneumothorax status post device implantation.  Wound care, arm mobility, and restrictions were reviewed with the patient.  The patient was examined and considered stable for discharge to home.    Anticoagulation resumption This patient is not on anticoagulation     Physical Exam: Vitals:   03/11/23 0650 03/11/23 0700 03/11/23 0804 03/11/23 0900  BP:  (!) 166/92 128/85 135/70  Pulse:  73  81  Resp:  13 13 (!) 21  Temp: 97.9 F (36.6 C)     TempSrc: Oral     SpO2:  99%  98%  Weight:      Height:        GEN- NAD. A&O x 3.  HEENT: Normocephalic, atraumatic Lungs- CTAB, Normal effort.  Heart- RRR, No M/G/R.  GI- Soft, NT, ND.  Extremities- No clubbing, cyanosis, or edema;  Skin- warm and dry, no rash or lesion, left chest without hematoma/ecchymosis  Discharge Medications:  Allergies as of 03/11/2023       Reactions   Bacitracin Swelling, Other (See Comments)   "Swelling," but not swelling of the throat   Erythromycin Base Swelling, Other (See Comments)   "Swelling," but not swelling of the throat   Losartan Other (See Comments)   Reaction not recalled   Ramipril Other (See Comments)   Reaction not recalled        Medication List     TAKE these medications    albuterol 108 (90 Base) MCG/ACT inhaler Commonly known as: VENTOLIN HFA Inhale 1-2 puffs into the lungs every 6 (six) hours as needed for wheezing or   shortness of breath.   amLODipine 2.5 MG tablet Commonly known as: NORVASC Take 1 tablet (2.5 mg total) by mouth daily. What changed: when to take this   ammonium lactate 12 % lotion Commonly known as: LAC-HYDRIN Apply 1 application. topically as needed for dry skin.   aspirin EC 81 MG tablet Take 81 mg by mouth in the morning. Swallow whole.   atenolol 25 MG tablet Commonly known as: TENORMIN Take 0.5 tablets (12.5 mg total) by mouth daily. What changed: when to take this   FLUoxetine 20 MG capsule Commonly known as: PROZAC Take  20 mg by mouth in the morning.   fluticasone 50 MCG/ACT nasal spray Commonly known as: FLONASE Place 1 spray into both nostrils 2 (two) times daily as needed for allergies or rhinitis.   hydrochlorothiazide 25 MG tablet Commonly known as: HYDRODIURIL Take 25 mg by mouth at bedtime.   latanoprost 0.005 % ophthalmic solution Commonly known as: XALATAN Place 1 drop into both eyes at bedtime.   memantine 10 MG tablet Commonly known as: NAMENDA Take 10 mg by mouth in the morning.   metFORMIN 500 MG 24 hr tablet Commonly known as: GLUCOPHAGE-XR Take 500 mg by mouth daily with breakfast.   naproxen 500 MG tablet Commonly known as: NAPROSYN Take 500 mg by mouth 2 (two) times daily as needed for mild pain.   nitroGLYCERIN 0.4 MG SL tablet Commonly known as: NITROSTAT Place 1 tablet (0.4 mg total) under the tongue every 5 (five) minutes as needed for chest pain.   potassium chloride SA 20 MEQ tablet Commonly known as: KLOR-CON M Take 20 mEq by mouth in the morning.   rosuvastatin 20 MG tablet Commonly known as: CRESTOR Take 1 tablet (20 mg total) by mouth daily.   Tylenol 8 Hour Arthritis Pain 650 MG CR tablet Generic drug: acetaminophen Take 1,300 mg by mouth every 8 (eight) hours as needed for pain.        Disposition:    Follow-up Information      [facility] Follow up.   Specialty: Cardiology Why: on 6/27 at 320 for post hospital follow up / pacemaker check Contact information: [address], [address] [id number] [place] [address] [phone]                Duration of Discharge Encounter: Greater than 30 minutes including physician time.  Signed, Michael Andrew Tillery, PA-C  03/11/2023 10:32 AM   

## 2023-03-11 NOTE — Progress Notes (Signed)
   03/11/23 1827  Vitals  Temp 97.8 F (36.6 C)  Temp Source Oral  BP 121/77  MAP (mmHg) 89  BP Location Right Arm  BP Method Automatic  Patient Position (if appropriate) Sitting  Pulse Rate 74  Pulse Rate Source Monitor  ECG Heart Rate 72  Resp 16  MEWS COLOR  MEWS Score Color Green  Oxygen Therapy  SpO2 92 %  O2 Device Room Air  Pain Assessment  Pain Scale 0-10  Pain Score 0  MEWS Score  MEWS Temp 0  MEWS Systolic 0  MEWS Pulse 0  MEWS RR 0  MEWS LOC 0  MEWS Score 0   Patient arrived onto the unit from [facility]. Patient is alert and oriented, daughter is at the bedside. Patient has 0/10 pain. VSS stable. Bed alarmed initiated.

## 2023-03-11 NOTE — Evaluation (Signed)
Physical Therapy Evaluation Patient Details Name: Jasmine Crawford MRN: [id number] DOB: 26-Sep-1940 Today's Date: 03/11/2023  History of Present Illness  82 y.o. female admitted 6/14 with Stokes-Adams attacks and found to have complete heart block.  Pacemaker placed 6/16.  PMH:  DM, HTN, HL, hypothyroidism, dementia and CAD  CAD  Clinical Impression  Pt admitted with above diagnosis.   Pt having difficulty with sit to stand and with ambulating even with RW. She has some weakness and needs cues for safety with transitions. She tends to veer left in walking and doesn't correct without physical assist and cues. Pt states she had 3 falls in last month.  Feel she would benefit from post acute rehab for < 2 hours day. Will follow acutely.   Pt currently with functional limitations due to the deficits listed below (see PT Problem List). Pt will benefit from acute skilled PT to increase their independence and safety with mobility to allow discharge.          Recommendations for follow up therapy are one component of a multi-disciplinary discharge planning process, led by the attending physician.  Recommendations may be updated based on patient status, additional functional criteria and insurance authorization.  Follow Up Recommendations Can patient physically be transported by private vehicle: No     Assistance Recommended at Discharge Frequent or constant Supervision/Assistance  Patient can return home with the following  A little help with walking and/or transfers;A little help with bathing/dressing/bathroom;Assistance with cooking/housework;Assist for transportation;Help with stairs or ramp for entrance    Equipment Recommendations None recommended by PT  Recommendations for Other Services       Functional Status Assessment Patient has had a recent decline in their functional status and demonstrates the ability to make significant improvements in function in a reasonable and predictable amount of  time.     Precautions / Restrictions Precautions Precautions: Fall Restrictions Weight Bearing Restrictions: No      Mobility  Bed Mobility               General bed mobility comments: NT in chair    Transfers Overall transfer level: Needs assistance Equipment used: Rolling walker (2 wheels) Transfers: Sit to/from Stand Sit to Stand: Min assist           General transfer comment: Needed min assist initially to stand as well as mod cues for hand placement. Pt did better with practice of 5 sit to stands but demonstrates poor carryover of information from beginning to end of session.    Ambulation/Gait Ambulation/Gait assistance: Min assist Gait Distance (Feet): 100 Feet Assistive device: Rolling walker (2 wheels) Gait Pattern/deviations: Step-through pattern, Decreased stride length, Drifts right/left, Trunk flexed   Gait velocity interpretation: <1.31 ft/sec, indicative of household ambulator   General Gait Details: Pt veers left with RW and could not self correct as PT had to help pt steer RW. Pt also needed cues for standing upright as she flexes as she fatigues. Does balance well with RW but would need someone walking with her for safety intiially upon d/c home.  Stairs            Wheelchair Mobility    Modified Rankin (Stroke Patients Only)       Balance Overall balance assessment: Needs assistance Sitting-balance support: No upper extremity supported, Feet supported Sitting balance-Leahy Scale: Fair     Standing balance support: Bilateral upper extremity supported, During functional activity, Reliant on assistive device for balance  Pertinent Vitals/Pain Pain Assessment Pain Assessment: Faces Faces Pain Scale: Hurts little more Pain Location: right low back Pain Descriptors / Indicators: Aching, Grimacing, Guarding Pain Intervention(s): Limited activity within patient's tolerance, Monitored  during session, Repositioned    Home Living Family/patient expects to be discharged to:: Private residence Living Arrangements: Alone Available Help at Discharge: Family;Available 24 hours/day (going to daughters in [place] but will be alone during day) Type of Home: House Home Access: Stairs to enter Entrance Stairs-Rails: None Entrance Stairs-Number of Steps: 3   Home Layout: One level Home Equipment: Cane - single Librarian, academic (2 wheels);BSC/3in1;Shower seat;Wheelchair - manual Additional Comments: 3 falls and most getting off commode    Prior Function Prior Level of Function : Needs assist;History of Falls (last six months)             Mobility Comments: used cane per pt ADLs Comments: Pt states she has been needing assit with ADLS     Hand Dominance   Dominant Hand: Right    Extremity/Trunk Assessment   Upper Extremity Assessment Upper Extremity Assessment: Defer to OT evaluation    Lower Extremity Assessment Lower Extremity Assessment: Generalized weakness    Cervical / Trunk Assessment Cervical / Trunk Assessment: Kyphotic  Communication   Communication: No difficulties  Cognition Arousal/Alertness: Awake/alert Behavior During Therapy: WFL for tasks assessed/performed Overall Cognitive Status: History of cognitive impairments - at baseline                                 General Comments: Pt with some safety awareness issues and needed cues for safety with sit to stand and witih ambulation. Pt needs cues for hand placement and also appeared to have some slowed processing.        General Comments General comments (skin integrity, edema, etc.): 85 bpm with other VSS    Exercises General Exercises - Lower Extremity Ankle Circles/Pumps: AROM, Both, 10 reps, Seated Long Arc Quad: AROM, Both, 10 reps, Seated   Assessment/Plan    PT Assessment Patient needs continued PT services  PT Problem List Decreased strength;Decreased range of  motion;Decreased activity tolerance;Decreased balance;Decreased mobility;Decreased knowledge of use of DME;Decreased safety awareness       PT Treatment Interventions      PT Goals (Current goals can be found in the Care Plan section)  Acute Rehab PT Goals Patient Stated Goal: to go home PT Goal Formulation: With patient Time For Goal Achievement: 03/25/23 Potential to Achieve Goals: Good    Frequency Min 3X/week     Co-evaluation               AM-PAC PT "6 Clicks" Mobility  Outcome Measure Help needed turning from your back to your side while in a flat bed without using bedrails?: A Lot Help needed moving from lying on your back to sitting on the side of a flat bed without using bedrails?: A Lot Help needed moving to and from a bed to a chair (including a wheelchair)?: A Little Help needed standing up from a chair using your arms (e.g., wheelchair or bedside chair)?: A Little Help needed to walk in hospital room?: A Little Help needed climbing 3-5 steps with a railing? : Total 6 Click Score: 14    End of Session Equipment Utilized During Treatment: Gait belt Activity Tolerance: Patient limited by fatigue Patient left: in chair;with call bell/phone within reach;with chair alarm set Nurse Communication: Mobility status PT  Visit Diagnosis: Muscle weakness (generalized) (M62.81);Other abnormalities of gait and mobility (R26.89)    Time: 1610-9604 PT Time Calculation (min) (ACUTE ONLY): 33 min   Charges:   PT Evaluation $PT Eval Moderate Complexity: 1 Mod PT Treatments $Gait Training: 8-22 mins        [facility] M,PT Acute Rehab Services [phone]   Bevelyn Buckles 03/11/2023, 11:48 AM

## 2023-03-11 NOTE — TOC Initial Note (Signed)
Transition of Care [facility]) - Initial/Assessment Note    Patient Details  Name: Jasmine Crawford MRN: [id number] Date of Birth: [birth year]  [facility] CM/SW Contact:    Delilah Shan, LCSWA Phone Number: 03/11/2023, 3:05 PM  Clinical Narrative:                  CSW received consult for possible SNF placement at time of discharge. CSW spoke with patient at bedside regarding PT recommendation of SNF placement at time of discharge.  Patient expressed understanding of PT recommendation and request for CSW to speak with her daughter Jasmine Crawford regarding PT recommendation of SNF placement at time of discharge. CSW spoke with patients daughter Jasmine Crawford regarding PT recommendation for SNF placement for patient at time of discharge. Patients daughter expressed understanding of PT recommendation and is agreeable for CSW to fax patient out for SNF near the [place] area. CSW discussed insurance authorization process. No further questions reported at this time. CSW to continue to follow and assist with discharge planning needs.   Expected Discharge Plan: Home w Home Health Services Barriers to Discharge: No Barriers Identified   Patient Goals and CMS Choice Patient states their goals for this hospitalization and ongoing recovery are:: undecided if the patient will return home with daughter in [place] vs daughter in [place].   Choice offered to / list presented to : NA      Expected Discharge Plan and Services In-house Referral: Clinical Social Work Discharge Planning Services: CM Consult Post Acute Care Choice: Home Health Living arrangements for the past 2 months: Single Family Home Expected Discharge Date: 03/11/23                 DME Agency: NA                  Prior Living Arrangements/Services Living arrangements for the past 2 months: Single Family Home Lives with:: Adult Children Patient language and need for interpreter reviewed:: Yes Do you feel safe  going back to the place where you live?: Yes      Need for Family Participation in Patient Care: Yes (Comment) Care giver support system in place?: Yes (comment) Current home services: DME (cane, rolling walker, wheelchair.) Criminal Activity/Legal Involvement Pertinent to Current Situation/Hospitalization: No - Comment as needed  Activities of Daily Living Home Assistive Devices/Equipment: Walker (specify type) ADL Screening (condition at time of admission) Patient's cognitive ability adequate to safely complete daily activities?: Yes Is the patient deaf or have difficulty hearing?: Yes Does the patient have difficulty seeing, even when wearing glasses/contacts?: Yes Does the patient have difficulty concentrating, remembering, or making decisions?: Yes Patient able to express need for assistance with ADLs?: Yes Does the patient have difficulty dressing or bathing?: No Independently performs ADLs?: Yes (appropriate for developmental age) Does the patient have difficulty walking or climbing stairs?: Yes Weakness of Legs: None Weakness of Arms/Hands: None  Permission Sought/Granted Permission sought to share information with : Family Supports, Case Manager                Emotional Assessment Appearance:: Appears stated age Attitude/Demeanor/Rapport: Engaged Affect (typically observed): Appropriate Orientation: : Oriented to Self, Oriented to Place Alcohol / Substance Use: Not Applicable Psych Involvement: No (comment)  Admission diagnosis:  Complete heart block (HCC) [I44.2] Complete heart block by electrocardiogram (HCC) [I44.2] Syncope, unspecified syncope type [R55] Patient Active Problem List   Diagnosis Date Noted   Complete heart block (HCC) 03/10/2023   Complete heart  block by electrocardiogram (HCC) 03/07/2023   Hypertensive urgency 01/01/2022   Thrombocytopenia (HCC) 01/01/2022   Type 2 diabetes mellitus with hyperlipidemia (HCC) 01/01/2022   Hypothyroidism  01/01/2022   Peripheral arterial disease (HCC) 01/01/2022   Dementia (HCC) 01/01/2022   Class 1 obesity 01/01/2022   Chest pain 12/31/2021   PCP:  Marguerita Beards, MD Pharmacy:   [facility] [phone] - [address], [place] - [address] [place] [address] Phone: [phone] Fax: [phone]     Social Determinants of Health (SDOH) Social History: SDOH Screenings   Food Insecurity: Food Insecurity Present (03/08/2023)  Housing: Medium Risk (03/08/2023)  Transportation Needs: No Transportation Needs (03/08/2023)  Utilities: Not At Risk (03/08/2023)  Tobacco Use: Low Risk  (03/11/2023)   SDOH Interventions:     Readmission Risk Interventions     No data to display

## 2023-03-11 NOTE — [place] FL2 (Signed)
[place] MEDICAID FL2 LEVEL OF CARE FORM     IDENTIFICATION  Patient Name: Jasmine Crawford Birthdate: [birth year] Sex: female Admission Date (Current Location): 03/07/2023  [facility] and [place] Number:  Producer, television/film/video and Address:  [facility], [address], [place], [place] [address]      Provider Number: [id number]  Attending Physician Name and Address:  Marinus Maw, MD  Relative Name and Phone Number:  Tad Moore [phone]    Current Level of Care: Hospital Recommended Level of Care: Skilled Nursing Facility Prior Approval Number:    Date Approved/Denied:   PASRR Number: [id number]  Discharge Plan: SNF    Current Diagnoses: Patient Active Problem List   Diagnosis Date Noted   Complete heart block (HCC) 03/10/2023   Complete heart block by electrocardiogram (HCC) 03/07/2023   Hypertensive urgency 01/01/2022   Thrombocytopenia (HCC) 01/01/2022   Type 2 diabetes mellitus with hyperlipidemia (HCC) 01/01/2022   Hypothyroidism 01/01/2022   Peripheral arterial disease (HCC) 01/01/2022   Dementia (HCC) 01/01/2022   Class 1 obesity 01/01/2022   Chest pain 12/31/2021    Orientation RESPIRATION BLADDER Height & Weight     Self, Place, Situation  Normal Continent Weight: 156 lb 15.5 oz (71.2 kg) Height:  5\' 4"  (162.6 cm)  BEHAVIORAL SYMPTOMS/MOOD NEUROLOGICAL BOWEL NUTRITION STATUS      Continent Diet (Please see discharge summary)  AMBULATORY STATUS COMMUNICATION OF NEEDS Skin   Limited Assist Verbally Other (Comment) (WDL,Appropriate for ethnicity,dry,Wound/Incision LDAs,Incision closed,sternum,L,upper)                       Personal Care Assistance Level of Assistance  Bathing, Feeding, Dressing Bathing Assistance: Limited assistance Feeding assistance: Independent Dressing Assistance: Limited assistance     Functional Limitations Info  Sight, Hearing, Speech Sight Info: Impaired (Blind-Left eye) Hearing Info:  Impaired Speech Info: Adequate    SPECIAL CARE FACTORS FREQUENCY  PT (By licensed PT), OT (By licensed OT)     PT Frequency: 5x min weekly OT Frequency: 5x min weekly            Contractures Contractures Info: Not present    Additional Factors Info  Code Status, Allergies, Insulin Sliding Scale Code Status Info: DNR Allergies Info: Bacitracin,Erythromycin Base,Losartan,Ramipril   Insulin Sliding Scale Info: insulin aspart (novoLOG) injection 0-9 Units 3 times daily with meals       Current Medications (03/11/2023):  This is the current hospital active medication list Current Facility-Administered Medications  Medication Dose Route Frequency Provider Last Rate Last Admin   0.9 %  sodium chloride infusion  250 mL Intravenous PRN Marinus Maw, MD       acetaminophen (TYLENOL) tablet 650 mg  650 mg Oral Q4H PRN Marinus Maw, MD   650 mg at 03/11/23 1128   amLODipine (NORVASC) tablet 2.5 mg  2.5 mg Oral Daily Graciella Freer, PA-C   2.5 mg at 03/11/23 1118   atenolol (TENORMIN) tablet 12.5 mg  12.5 mg Oral Daily Graciella Freer, PA-C   12.5 mg at 03/11/23 1142   Chlorhexidine Gluconate Cloth 2 % PADS 6 each  6 each Topical Daily Marinus Maw, MD   6 each at 03/11/23 1000   feeding supplement (ENSURE ENLIVE / ENSURE PLUS) liquid 237 mL  237 mL Oral BID BM Marinus Maw, MD   237 mL at 03/11/23 1113   insulin aspart (novoLOG) injection 0-9 Units  0-9 Units Subcutaneous TID WC  Marinus Maw, MD   3 Units at 03/11/23 1237   ondansetron (ZOFRAN) injection 4 mg  4 mg Intravenous Q6H PRN Marinus Maw, MD       Oral care mouth rinse  15 mL Mouth Rinse PRN Marinus Maw, MD       sodium chloride flush (NS) 0.9 % injection 3 mL  3 mL Intravenous Q12H Marinus Maw, MD   3 mL at 03/11/23 1119   sodium chloride flush (NS) 0.9 % injection 3 mL  3 mL Intravenous PRN Marinus Maw, MD       sodium chloride flush (NS) 0.9 % injection 3 mL  3 mL Intravenous Q12H  Marinus Maw, MD   3 mL at 03/11/23 1119     Discharge Medications: Please see discharge summary for a list of discharge medications.  Relevant Imaging Results:  Relevant Lab Results:   Additional Information [id number]  Delilah Shan, LCSWA

## 2023-03-12 LAB — GLUCOSE, CAPILLARY
Glucose-Capillary: 151 mg/dL — ABNORMAL HIGH (ref 70–99)
Glucose-Capillary: 169 mg/dL — ABNORMAL HIGH (ref 70–99)
Glucose-Capillary: 133 mg/dL — ABNORMAL HIGH (ref 70–99)
Glucose-Capillary: 194 mg/dL — ABNORMAL HIGH (ref 70–99)

## 2023-03-12 NOTE — Progress Notes (Addendum)
  Patient Name: Jasmine Crawford Date of Encounter: 03/12/2023  Primary Cardiologist: Dietrich Pates, MD Electrophysiologist: None  Interval Summary   No new complaints this am. Pending SNF placement  Inpatient Medications    Scheduled Meds:  amLODipine  2.5 mg Oral Daily   atenolol  12.5 mg Oral Daily   Chlorhexidine Gluconate Cloth  6 each Topical Daily   feeding supplement  237 mL Oral BID BM   insulin aspart  0-9 Units Subcutaneous TID WC   sodium chloride flush  3 mL Intravenous Q12H   sodium chloride flush  3 mL Intravenous Q12H   Continuous Infusions:  sodium chloride     PRN Meds: sodium chloride, acetaminophen, ondansetron (ZOFRAN) IV, mouth rinse, sodium chloride flush   Vital Signs    Vitals:   03/11/23 1927 03/12/23 0004 03/12/23 0519 03/12/23 0716  BP: 125/67 (!) 144/73 124/84 (!) 141/72  Pulse: 74  78 72  Resp: 18 17 18 18   Temp: 97.8 F (36.6 C) 98.2 F (36.8 C) 98.7 F (37.1 C) 97.8 F (36.6 C)  TempSrc: Oral Oral Oral   SpO2: 98% 99% 100% 100%  Weight:   70.9 kg   Height:        Intake/Output Summary (Last 24 hours) at 03/12/2023 0819 Last data filed at 03/12/2023 0500 Gross per 24 hour  Intake 120 ml  Output 350 ml  Net -230 ml   Filed Weights   03/11/23 0500 03/11/23 1822 03/12/23 0519  Weight: 71.2 kg 72.3 kg 70.9 kg    Physical Exam    GEN- The patient is well appearing, alert and oriented x 3 today.   Lungs- Clear to ausculation bilaterally, normal work of breathing Cardiac- Regular rate and rhythm, no murmurs, rubs or gallops GI- soft, NT, ND, + BS Extremities- no clubbing or cyanosis. No edema  Telemetry    V pacing at 70s (personally reviewed)  Hospital Course    Jasmine Crawford is a 82 y.o. female admitted for CHB. Underwent PPM 6/16.    6/17 am had weakness transitioning. PT recommends SNF vs 24 hr supervision  Assessment & Plan    CHB S/p Abbott PPM 03/10/2023 CXR stable 6/17 Wound care and arm restrictions reviewed with pt  and placed in AVS.   Deconditioning Pending SNF Bed.  On going discussions with CSW/TOC for SNF placement.   Home pending SNF availability.   For questions or updates, please contact [facility] Please consult [email or web address] for contact info under Cardiology/STEMI.  Signed, Graciella Freer, PA-C  03/12/2023, 8:19 AM   EP Attending  Patient seen and examined. Agree with the findings as noted above. The patient is doing well after DDD PM insertion. Her device is working normally and CXR demonstrates normal DDD PM function. She will be discharged with usual followup.   Jasmine Gowda Willie Loy,MD

## 2023-03-12 NOTE — Care Management Important Message (Signed)
Important Message  Patient Details  Name: Jasmine Crawford MRN: [id number] Date of Birth: 1940/11/03   Medicare Important Message Given:  Yes     Sherilyn Banker 03/12/2023, 2:12 PM

## 2023-03-12 NOTE — TOC Progression Note (Addendum)
Transition of Care [facility]) - Progression Note    Patient Details  Name: Jasmine Crawford MRN: [id number] Date of Birth: 1941-02-19  [facility]) CM/SW Contact  Leander Rams, LCSW Phone Number: 03/12/2023, 10:59 AM  Clinical Narrative:    CSW met with pt at bedside to provide SNF bed offers. Pt states that she will wait for her daughter to comeback this afternoon so they can review list and make a decision. CSW requested that daughter/pt give CSW a call to inform them of SNF decision. Pt agreed to do so. CSW left phone number for daughter to call.   TOC will continue to follow.   4:15PM MD informed CSW daughter would like to choose [facility] SNF. CSW has started insurance auth.   Expected Discharge Plan: Home w Home Health Services Barriers to Discharge: No Barriers Identified  Expected Discharge Plan and Services In-house Referral: Clinical Social Work Discharge Planning Services: CM Consult Post Acute Care Choice: Home Health Living arrangements for the past 2 months: Single Family Home Expected Discharge Date: 03/11/23                 DME Agency: NA                   Social Determinants of Health (SDOH) Interventions SDOH Screenings   Food Insecurity: Food Insecurity Present (03/08/2023)  Housing: Medium Risk (03/08/2023)  Transportation Needs: No Transportation Needs (03/08/2023)  Utilities: Not At Risk (03/08/2023)  Tobacco Use: Low Risk  (03/11/2023)    Readmission Risk Interventions     No data to display         .jnt

## 2023-03-12 NOTE — Progress Notes (Signed)
Mobility Specialist Progress Note:   03/12/23 1222  Mobility  Activity Ambulated with assistance in hallway  Level of Assistance Minimal assist, patient does 75% or more  Assistive Device Front wheel walker  Distance Ambulated (ft) 250 ft  Activity Response Tolerated well  Mobility Referral Yes  $Mobility charge 1 Mobility  Mobility Specialist Start Time (ACUTE ONLY) 1135  Mobility Specialist Stop Time (ACUTE ONLY) 1145  Mobility Specialist Time Calculation (min) (ACUTE ONLY) 10 min    Pre Mobility: 74 HR  During Mobility: 84 HR  Post Mobility: 126/84 BP  Pt received in chair, agreeable to mobility. C/o feeling lightheaded and weak, requires assistance to correct while ambulating. Pt returned to chair with call bell in hand.   Jasmine Crawford  Mobility Specialist Please contact via [employer] office at [phone]

## 2023-03-13 LAB — GLUCOSE, CAPILLARY
Glucose-Capillary: 146 mg/dL — ABNORMAL HIGH (ref 70–99)
Glucose-Capillary: 241 mg/dL — ABNORMAL HIGH (ref 70–99)

## 2023-03-13 NOTE — Progress Notes (Signed)
Mobility Specialist Progress Note:   03/13/23 1024  Mobility  Activity Ambulated with assistance in hallway  Level of Assistance Minimal assist, patient does 75% or more  Assistive Device Front wheel walker  Distance Ambulated (ft) 120 ft  Activity Response Tolerated well  Mobility Referral Yes  $Mobility charge 1 Mobility  Mobility Specialist Start Time (ACUTE ONLY) 0950  Mobility Specialist Stop Time (ACUTE ONLY) 1010  Mobility Specialist Time Calculation (min) (ACUTE ONLY) 20 min    Pre Mobility: 141/77 BP    Pt received in bed agreeable to mobility. MinA to stand, slight balance correction during ambulation. C/o feeling weak and lightheaded during session due to first time being out of bed today. Pt left in chair with call bell in hand and all needs met.   Leory Plowman  Mobility Specialist Please contact via [employer] office at [phone]

## 2023-03-13 NOTE — TOC Transition Note (Signed)
Transition of Care [facility]) - CM/SW Discharge Note   Patient Details  Name: Jasmine Crawford MRN: [id number] Date of Birth: 10/14/1940  Transition of Care [facility]) CM/SW Contact:  Leander Rams, LCSW Phone Number: 03/13/2023, 12:51 PM   Clinical Narrative:    Patient will DC to: [facility] Anticipated DC date: 03/13/2023 Family notified: [facility] Transport by: [name]   Per MD patient ready for DC to [facility]. RN, patient, patient's family, and facility notified of DC. Discharge Summary and FL2 sent to facility. RN to call report prior to discharge [phone]. DC packet on chart. Ambulance transport requested for patient.   CSW will sign off for now as social work intervention is no longer needed. Please consult [place] again if new needs arise.      Barriers to Discharge: No Barriers Identified   Patient Goals and CMS Choice   Choice offered to / list presented to : NA  Discharge Placement                         Discharge Plan and Services Additional resources added to the After Visit Summary for   In-house Referral: Clinical Social Work Discharge Planning Services: CM Consult Post Acute Care Choice: Home Health            DME Agency: NA                  Social Determinants of Health (SDOH) Interventions SDOH Screenings   Food Insecurity: Food Insecurity Present (03/08/2023)  Housing: Medium Risk (03/08/2023)  Transportation Needs: No Transportation Needs (03/08/2023)  Utilities: Not At Risk (03/08/2023)  Tobacco Use: Low Risk  (03/11/2023)     Readmission Risk Interventions     No data to display           Oletta Lamas, MSW, LCSWA, LCASA Transitions of Care  Clinical Social Worker I

## 2023-03-13 NOTE — TOC Progression Note (Signed)
Transition of Care [facility]) - Progression Note    Patient Details  Name: Jasmine Crawford MRN: [id number] Date of Birth: 15-Apr-1941  Transition of Care [facility]) CM/SW Contact  Leander Rams, LCSW Phone Number: 03/13/2023, 9:56 AM  Clinical Narrative:    Pt has insurance auth for [employer]. Berkley Harvey ID# is [id number]. MD made aware. Plan is for pt to dc to [facility] SNF today.    Expected Discharge Plan: Home w Home Health Services Barriers to Discharge: No Barriers Identified  Expected Discharge Plan and Services In-house Referral: Clinical Social Work Discharge Planning Services: CM Consult Post Acute Care Choice: Home Health Living arrangements for the past 2 months: Single Family Home Expected Discharge Date: 03/13/23                 DME Agency: NA                   Social Determinants of Health (SDOH) Interventions SDOH Screenings   Food Insecurity: Food Insecurity Present (03/08/2023)  Housing: Medium Risk (03/08/2023)  Transportation Needs: No Transportation Needs (03/08/2023)  Utilities: Not At Risk (03/08/2023)  Tobacco Use: Low Risk  (03/11/2023)    Readmission Risk Interventions     No data to display        Oletta Lamas, MSW, LCSWA, LCASA Transitions of Care  Clinical Social Worker I

## 2023-03-13 NOTE — Progress Notes (Signed)
  No new complaints this am.   Seen with Dr. Elberta Fortis.   Full note pending disposition.   Insurance auth started yesterday afternoon for SNF.   Casimiro Needle [address]" Sp[place], PA-C  03/13/2023 8:01 AM

## 2023-03-13 NOTE — Plan of Care (Signed)

## 2023-03-21 ENCOUNTER — Ambulatory Visit: Payer: Medicare HMO | Attending: Internal Medicine

## 2023-04-08 ENCOUNTER — Encounter: Payer: Self-pay | Admitting: Internal Medicine

## 2023-04-08 ENCOUNTER — Ambulatory Visit: Payer: Medicare HMO | Attending: Internal Medicine | Admitting: Internal Medicine

## 2023-04-08 VITALS — BP 129/71 | HR 67

## 2023-04-08 DIAGNOSIS — I442 Atrioventricular block, complete: Secondary | ICD-10-CM | POA: Diagnosis not present

## 2023-04-08 LAB — CUP PACEART INCLINIC DEVICE CHECK
Battery Remaining Longevity: 57 mo
Battery Voltage: 2.99 V
Brady Statistic RA Percent Paced: 7.5 %
Brady Statistic RV Percent Paced: 99.77 %
Date Time Interrogation Session: 20240715133523
Implantable Lead Connection Status: 753985
Implantable Lead Connection Status: 753985
Implantable Lead Implant Date: 20240616
Implantable Lead Implant Date: 20240616
Implantable Lead Location: 753859
Implantable Lead Location: 753860
Implantable Pulse Generator Implant Date: 20240616
Lead Channel Impedance Value: 487.5 Ohm
Lead Channel Impedance Value: 550 Ohm
Lead Channel Pacing Threshold Amplitude: 0.75 V
Lead Channel Pacing Threshold Amplitude: 0.75 V
Lead Channel Pacing Threshold Amplitude: 0.75 V
Lead Channel Pacing Threshold Amplitude: 0.75 V
Lead Channel Pacing Threshold Pulse Width: 0.5 ms
Lead Channel Pacing Threshold Pulse Width: 0.5 ms
Lead Channel Pacing Threshold Pulse Width: 0.5 ms
Lead Channel Pacing Threshold Pulse Width: 0.5 ms
Lead Channel Sensing Intrinsic Amplitude: 12 mV
Lead Channel Sensing Intrinsic Amplitude: 4.3 mV
Lead Channel Setting Pacing Amplitude: 3.5 V
Lead Channel Setting Pacing Amplitude: 5 V
Lead Channel Setting Pacing Pulse Width: 0.5 ms
Lead Channel Setting Sensing Sensitivity: 4 mV
Pulse Gen Model: 2272
Pulse Gen Serial Number: 8185505

## 2023-04-08 NOTE — Progress Notes (Signed)
Wound check appointment. Steri-strips already removed by patient. Wound without redness or edema. Incision edges approximated.  There is 1 small, pin point open area with a stitch present at incision line. No signs of redness or infection.  Reviewed with Dr. Ladona Ridgel, stitch clipped to surface of skin.  Wound care instructions given to cleanse bid with dial soap/water rinse well using clean cloth each wash X 1 week.  Monitor for s/s of infection.  Device clinic number given if any concerns develop.    Normal device function. Thresholds, sensing, and impedances consistent with implant measurements. Device programmed at 3.5V/auto capture, RV at 5.0@.5ms programmed on for extra safety margin until 3 month visit. Histogram distribution appropriate for patient and level of activity. 3 AMS episodes, all appear AT, longest 8 seconds, no high ventricular rates noted. Patient educated about wound care, arm mobility, lifting restrictions. ROV in 3 months with implanting physician.

## 2023-04-08 NOTE — Patient Instructions (Addendum)
   After Your Pacemaker   Monitor your pacemaker site for redness, swelling, and drainage. Call the device clinic at [phone] if you experience these symptoms or fever/chills.  Your incision was closed with Steri-strips or staples:  You may shower 7 days after your procedure and wash your incision with soap and water. Avoid lotions, ointments, or perfumes over your incision until it is well-healed.  There is 1 small open area that is still healing.  Wash twice daily for 1 week with Dial soap and rinse well using clean cloth each time.  Contact our office at number provided above ASAP if any signs of infection develop or does not continue to heal.   You may use a hot tub or a pool after your wound completely heals and the incision is completely closed.  Do not lift, push or pull greater than 10 pounds with the affected arm until 6 weeks after your procedure. UNTIL AFTER JULY 28TH. There are no other restrictions in arm movement after your wound check appointment.  You may drive, unless driving has been restricted by your healthcare providers.  Remote monitoring is used to monitor your pacemaker from home. This monitoring is scheduled every 91 days by our office. It allows [place] to keep an eye on the functioning of your device to ensure it is working properly. You will routinely see your Electrophysiologist annually (more often if necessary).

## 2023-04-08 NOTE — Progress Notes (Signed)
HPI Jasmine Crawford presents today to our device clinic for a wound check. She has a h/o CHB s/p PPM insertion about 2 weeks ago. She has done well in the interim.  No fever or chills and no trouble healing. She denies chest pain, sob, or syncope Allergies  Allergen Reactions   Bacitracin Swelling and Other (See Comments)    "Swelling," but not swelling of the throat   Erythromycin Base Swelling and Other (See Comments)    "Swelling," but not swelling of the throat   Losartan Other (See Comments)    Reaction not recalled   Ramipril Other (See Comments)    Reaction not recalled      Current Outpatient Medications  Medication Sig Dispense Refill   albuterol (VENTOLIN HFA) 108 (90 Base) MCG/ACT inhaler Inhale 1-2 puffs into the lungs every 6 (six) hours as needed for wheezing or shortness of breath.     amLODipine (NORVASC) 2.5 MG tablet Take 1 tablet (2.5 mg total) by mouth daily. (Patient taking differently: Take 2.5 mg by mouth at bedtime.) 90 tablet 3   ammonium lactate (LAC-HYDRIN) 12 % lotion Apply 1 application. topically as needed for dry skin.     aspirin EC 81 MG tablet Take 81 mg by mouth in the morning. Swallow whole.     atenolol (TENORMIN) 25 MG tablet Take 0.5 tablets (12.5 mg total) by mouth daily. (Patient taking differently: Take 12.5 mg by mouth at bedtime.) 45 tablet 3   FLUoxetine (PROZAC) 20 MG capsule Take 20 mg by mouth in the morning.     fluticasone (FLONASE) 50 MCG/ACT nasal spray Place 1 spray into both nostrils 2 (two) times daily as needed for allergies or rhinitis.     hydrochlorothiazide (HYDRODIURIL) 25 MG tablet Take 25 mg by mouth at bedtime.     latanoprost (XALATAN) 0.005 % ophthalmic solution Place 1 drop into both eyes at bedtime.     memantine (NAMENDA) 10 MG tablet Take 10 mg by mouth in the morning.     metFORMIN (GLUCOPHAGE-XR) 500 MG 24 hr tablet Take 500 mg by mouth daily with breakfast.     naproxen (NAPROSYN) 500 MG tablet Take 500 mg by  mouth 2 (two) times daily as needed for mild pain.     nitroGLYCERIN (NITROSTAT) 0.4 MG SL tablet Place 1 tablet (0.4 mg total) under the tongue every 5 (five) minutes as needed for chest pain. 90 tablet 3   potassium chloride SA (KLOR-CON M) 20 MEQ tablet Take 20 mEq by mouth in the morning.     rosuvastatin (CRESTOR) 20 MG tablet Take 1 tablet (20 mg total) by mouth daily. 30 tablet 0   TYLENOL 8 HOUR ARTHRITIS PAIN 650 MG CR tablet Take 1,300 mg by mouth every 8 (eight) hours as needed for pain.     No current facility-administered medications for this visit.     Past Medical History:  Diagnosis Date   Arthritis    Blind left eye    Diabetes (HCC)    Hyperlipidemia    Hypertension    Hypothyroid    PAD (peripheral artery disease) (HCC)     ROS:   All systems reviewed and negative except as noted in the HPI.   Past Surgical History:  Procedure Laterality Date   EYE SURGERY Left    had glaucoma   PACEMAKER IMPLANT N/A 03/10/2023   Procedure: PACEMAKER IMPLANT;  Surgeon: Marinus Maw, MD;  Location: [facility] INVASIVE CV LAB;  Service: Cardiovascular;  Laterality: N/A;     No family history on file.   Social History   Socioeconomic History   Marital status: Widowed    Spouse name: Not on file   Number of children: Not on file   Years of education: Not on file   Highest education level: Not on file  Occupational History   Not on file  Tobacco Use   Smoking status: Never   Smokeless tobacco: Never  Vaping Use   Vaping status: Never Used  Substance and Sexual Activity   Alcohol use: Never   Drug use: Never   Sexual activity: Never  Other Topics Concern   Not on file  Social History Narrative   Not on file   Social Determinants of Health   Financial Resource Strain: Low Risk  (11/06/2022)   [facility], [facility]   Overall Financial Resource Strain (CARDIA)    Difficulty of Paying Living Expenses: Not hard at all  Food Insecurity: Food  Insecurity Present (03/08/2023)   Hunger Vital Sign    Worried About Running Out of Food in the Last Year: Sometimes true    Ran Out of Food in the Last Year: Sometimes true  Transportation Needs: No Transportation Needs (03/08/2023)   PRAPARE - Administrator, Civil Service (Medical): No    Lack of Transportation (Non-Medical): No  Physical Activity: Insufficiently Active (08/04/2020)   [facility], [facility]   Exercise Vital Sign    Days of Exercise per Week: 5 days    Minutes of Exercise per Session: 10 min  Stress: Not on file  Social Connections: Not on file  Intimate Partner Violence: Not At Risk (03/08/2023)   Humiliation, Afraid, Rape, and Kick questionnaire    Fear of Current or Ex-Partner: No    Emotionally Abused: No    Physically Abused: No    Sexually Abused: No     BP 129/71   Pulse 67   Physical Exam:  Well appearing NAD HEENT: Unremarkable Neck:  No JVD, no thyromegally  Lungs:  Incision with a small stitch which was cut. No bleeding or ecchymosis.   DEVICE  Normal device function.  See PaceArt for details.   Assess/Plan: CHB - she is doing well s/p PPM insertion.  She had an escape at 40/min. PPM - her device appears to be working normally.  Jasmine Crawford Alivea Gladson,MD

## 2023-04-11 ENCOUNTER — Ambulatory Visit: Payer: Medicare HMO

## 2023-05-09 ENCOUNTER — Ambulatory Visit: Payer: Medicare HMO | Admitting: Internal Medicine

## 2023-07-08 ENCOUNTER — Encounter: Payer: Medicare HMO | Admitting: Internal Medicine

## 2023-07-10 NOTE — Progress Notes (Unsigned)
Cardiology Office Note    Date:  07/11/2023  ID:  Debbe Crumble, DOB 06/01/1941, MRN [id number] PCP:  Marguerita Beards, MD  Cardiologist:  Dietrich Pates, MD  Electrophysiologist:  Lewayne Bunting, MD   Chief Complaint: Follow up for CAD  History of Present Illness: .    Jasmine Crawford is a 82 y.o. female with visit-pertinent history of CAD per CCTA plans for conservative management, CHB s/p PPM, HTN, HLD, DM, ?PAD (details unclear), and dementia.  First evaluated by cardiology service in 12/2021 after she presented to the ED with chest pain with dyspnea in the setting of hypertensive urgency.  Troponins were negative x 4, EKG with left bundle branch block.  Echocardiogram showed EF 60 to 65%, no wall motion abnormalities.  Coronary CTA showed coronary calcium score of 248, this was 63rd percentile, she had moderate left main stenosis, moderate LCx stenosis and mild CAD in the LAD/RCA, CT FFR was negative although with CCTA concerning for left main disease greater than 50% cardiac catheterization was recommended.  Patient wished for conservative management without cardiac catheterization.  In 01/2023 office was notified regarding low heart rates, medications were adjusted and was recommended she wear a cardiac monitor.  On 03/07/2023 she presented to the ED via EMS following a syncopal event at home, she was found to be in complete heart block, with new RBBB/LAFB.  Cardiac catheterization was again recommended, patient declined.  Echocardiogram indicated LVEF of 55%, no RWMA, indeterminate diastolic parameters, RV systolic function moderately reduced, RV moderately enlarged, mild mitral valve regurgitation and moderate mitral annular calcification, aortic valve sclerosis with no evidence of stenosis.  On 03/10/2023 she had implantation of an Abbott dual-chamber PPM by Dr. Ladona Ridgel.  She was last seen in clinic on 04/08/2023 by Dr. Ladona Ridgel for device wound check, she had remained stable from a cardiac  perspective.  Today she presents for follow up with her daughter. They reports she is doing well. Her daughter reports she had a biopsy completed this morning at [facility] to test for lewy body or parkinson's disease. She denies chest pain, they report one episode of dyspnea on exertion, quickly resolved with rest. She denies orthopnea, pnd or lower extremity edema. Patient notes some occasional discomfort at her pacemaker. They report she has overall done well, her daughter notes she sleeps frequently and they have been working to encourage continued mobility.   Labwork independently reviewed: 03/09/2023: Sodium 136, potassium 3.5, creatinine 0.91, hemoglobin 16.9, HCT 49.4   ROS: .   Today she denies chest pain, shortness of breath, lower extremity edema, fatigue, palpitations, melena, hematuria, hemoptysis, diaphoresis, weakness, presyncope, syncope, orthopnea, and PND.  All other systems are reviewed and otherwise negative. Studies Reviewed: Marland Kitchen    EKG:  EKG is ordered today, personally reviewed, demonstrating  EKG Interpretation Date/Time:  Thursday July 11 2023 11:41:28 EDT Ventricular Rate:  71 PR Interval:  174 QRS Duration:  130 QT Interval:  436 QTC Calculation: 473 R Axis:   66  Text Interpretation: Atrial-sensed ventricular-paced rhythm When compared with ECG of 11-Mar-2023 06:57, Vent. rate has decreased BY   7 BPM Confirmed by Reather Littler [phone]) on 07/11/2023 12:51:52 PM   CV Studies:  Cardiac Studies & Procedures       ECHOCARDIOGRAM  ECHOCARDIOGRAM COMPLETE 03/08/2023  Narrative ECHOCARDIOGRAM REPORT    Patient Name:   Jasmine Crawford Date of Exam: 03/08/2023 Medical Rec #:  [id number]    Height:       64.0 in Accession #:  [id number]   Weight:       153.0 lb Date of Birth:  [birth year]    BSA:          1.746 m Patient Age:    81 years     BP:           160/61 mmHg Patient Gender: F            HR:           41 bpm. Exam Location:  Inpatient  Procedure: 2D Echo,  Cardiac Doppler and Color Doppler  Indications:    Syncope  History:        Patient has prior history of Echocardiogram examinations, most recent 01/01/2022. CAD, PAD, Arrythmias:complete heart block and Bradycardia, Signs/Symptoms:Chest Pain, Dizziness/Lightheadedness and Syncope; Risk Factors:Hypertension, Diabetes and Dyslipidemia.  Sonographer:    Wallie Char Referring Phys: [id number] PAULA V ROSS   Sonographer Comments: Technically difficult study due to poor echo windows. IMPRESSIONS   1. Left ventricular ejection fraction, by estimation, is 55%. The left ventricle has normal function. The left ventricle has no regional wall motion abnormalities. Left ventricular diastolic parameters are indeterminate. 2. Right ventricular systolic function is moderately reduced. The right ventricular size is moderately enlarged. 3. Left atrial size was moderately dilated. 4. Right atrial size was moderately dilated. 5. The mitral valve is abnormal. Mild mitral valve regurgitation. No evidence of mitral stenosis. Moderate mitral annular calcification. 6. The aortic valve is tricuspid. There is moderate calcification of the aortic valve. There is moderate thickening of the aortic valve. Aortic valve regurgitation is not visualized. Aortic valve sclerosis is present, with no evidence of aortic valve stenosis. 7. The inferior vena cava is normal in size with greater than 50% respiratory variability, suggesting right atrial pressure of 3 mmHg.  FINDINGS Left Ventricle: Left ventricular ejection fraction, by estimation, is 55%. The left ventricle has normal function. The left ventricle has no regional wall motion abnormalities. The left ventricular internal cavity size was normal in size. There is no left ventricular hypertrophy. Left ventricular diastolic parameters are indeterminate.  Right Ventricle: The right ventricular size is moderately enlarged. No increase in right ventricular wall thickness.  Right ventricular systolic function is moderately reduced.  Left Atrium: Left atrial size was moderately dilated.  Right Atrium: Right atrial size was moderately dilated.  Pericardium: There is no evidence of pericardial effusion.  Mitral Valve: The mitral valve is abnormal. There is moderate thickening of the mitral valve leaflet(s). There is moderate calcification of the mitral valve leaflet(s). Moderate mitral annular calcification. Mild mitral valve regurgitation. No evidence of mitral valve stenosis. MV peak gradient, 3.6 mmHg. The mean mitral valve gradient is 1.5 mmHg.  Tricuspid Valve: The tricuspid valve is normal in structure. Tricuspid valve regurgitation is mild . No evidence of tricuspid stenosis.  Aortic Valve: The aortic valve is tricuspid. There is moderate calcification of the aortic valve. There is moderate thickening of the aortic valve. Aortic valve regurgitation is not visualized. Aortic valve sclerosis is present, with no evidence of aortic valve stenosis. Aortic valve mean gradient measures 5.5 mmHg. Aortic valve peak gradient measures 11.0 mmHg. Aortic valve area, by VTI measures 2.11 cm.  Pulmonic Valve: The pulmonic valve was normal in structure. Pulmonic valve regurgitation is not visualized. No evidence of pulmonic stenosis.  Aorta: The aortic root is normal in size and structure.  Venous: The inferior vena cava is normal in size with greater than 50% respiratory variability, suggesting right atrial pressure of 3  mmHg.  IAS/Shunts: No atrial level shunt detected by color flow Doppler.   LEFT VENTRICLE PLAX 2D LVIDd:         4.00 cm     Diastology LVIDs:         2.80 cm     LV e' medial:    9.92 cm/s LV PW:         1.00 cm     LV E/e' medial:  7.9 LV IVS:        0.80 cm     LV e' lateral:   8.87 cm/s LVOT diam:     1.90 cm     LV E/e' lateral: 8.8 LV SV:         89 LV SV Index:   51 LVOT Area:     2.84 cm  LV Volumes (MOD) LV vol d, MOD A2C: 65.1  ml LV vol d, MOD A4C: 75.4 ml LV vol s, MOD A2C: 27.2 ml LV vol s, MOD A4C: 33.6 ml LV SV MOD A2C:     37.9 ml LV SV MOD A4C:     75.4 ml LV SV MOD BP:      38.8 ml  RIGHT VENTRICLE            IVC RV Basal diam:  4.30 cm    IVC diam: 1.50 cm RV S prime:     9.28 cm/s TAPSE (M-mode): 2.1 cm  LEFT ATRIUM             Index        RIGHT ATRIUM           Index LA diam:        4.50 cm 2.58 cm/m   RA Area:     18.40 cm LA Vol (A2C):   62.7 ml 35.91 ml/m  RA Volume:   49.30 ml  28.24 ml/m LA Vol (A4C):   71.6 ml 41.01 ml/m LA Biplane Vol: 67.3 ml 38.55 ml/m AORTIC VALVE AV Area (Vmax):    2.23 cm AV Area (Vmean):   2.21 cm AV Area (VTI):     2.11 cm AV Vmax:           165.50 cm/s AV Vmean:          110.500 cm/s AV VTI:            0.424 m AV Peak Grad:      11.0 mmHg AV Mean Grad:      5.5 mmHg LVOT Vmax:         130.00 cm/s LVOT Vmean:        86.150 cm/s LVOT VTI:          0.315 m LVOT/AV VTI ratio: 0.74  AORTA Ao Root diam: 3.30 cm  MITRAL VALVE               TRICUSPID VALVE MV Area (PHT): 2.33 cm    TR Peak grad:   39.7 mmHg MV Area VTI:   2.80 cm    TR Vmax:        315.00 cm/s MV Peak grad:  3.6 mmHg MV Mean grad:  1.5 mmHg    SHUNTS MV Vmax:       0.95 m/s    Systemic VTI:  0.32 m MV Vmean:      54.6 cm/s   Systemic Diam: 1.90 cm MV Decel Time: 326 msec MV E velocity: 78.33 cm/s MV A velocity: 80.17 cm/s MV E/A ratio:  0.98  Charlton Haws MD Electronically signed by Charlton Haws MD Signature Date/Time: 03/08/2023/10:07:19 AM    Final     CT SCANS  CT CORONARY MORPH W/CTA COR W/SCORE 01/01/2022  Addendum 01/01/2022  5:06 PM ADDENDUM REPORT: 01/01/2022 17:04  CLINICAL DATA:  Chest pain  EXAM: Cardiac/Coronary CTA  TECHNIQUE: A non-contrast, gated CT scan was obtained with axial slices of 3 mm through the heart for calcium scoring. Calcium scoring was performed using the Agatston method. A 120 kV prospective, gated, contrast cardiac scan was  obtained. Gantry rotation speed was 250 msecs and collimation was 0.6 mm. Two sublingual nitroglycerin tablets (0.8 mg) were given. The 3D data set was reconstructed in 5% intervals of the 35-75% of the R-R cycle. Diastolic phases were analyzed on a dedicated workstation using MPR, MIP, and VRT modes. The patient received 95 cc of contrast.  FINDINGS: Image quality: Excellent.  Noise artifact is: Limited.  Coronary Arteries:  Normal coronary origin.  Right dominance.  Left main: The left main is a large caliber vessel with a normal take off from the left coronary cusp that bifurcates to form a left anterior descending artery and a left circumflex artery. There is concern for moderate non-calcified plaque (50-69%).  Left anterior descending artery: The proximal LAD contains mild mixed density plaque (25-49%). The mid LAD contains mild non-calcified plaque (25-49%). The distal LAD is patent.  The LAD is patent without evidence of plaque or stenosis. The LAD gives off 1 patent diagonal branch.  Left circumflex artery: The LCX is non-dominant. The proximal LCX contains moderate mixed density plaque (50-69%). The distal LCX contains moderate mixed density plaque (50-69%). OM1 contains mild non-calcified plaque (25-49%). OM2 and OM3 are patent.  Right coronary artery: The RCA is dominant with normal take off from the right coronary cusp. The proximal RCA contains mild mixed density plaque (25-49%). The mid and distal segments contains minimal CAD (<25%). The RCA terminates as a PDA and right posterolateral branch. The PDA is patent. The PLV contains mild non-calcified plaque (25-49%).  Right Atrium: Right atrial size is within normal limits.  Right Ventricle: The right ventricular cavity is within normal limits.  Left Atrium: Left atrial size is normal in size with no left atrial appendage filling defect.  Left Ventricle: The ventricular cavity size is within normal  limits.  Pulmonary arteries: Normal in size without proximal filling defect.  Pulmonary veins: Normal pulmonary venous drainage.  Pericardium: Normal thickness without significant effusion or calcium present.  Cardiac valves: The aortic valve is trileaflet without significant calcification. The mitral valve is normal without significant calcification.  Aorta: Normal caliber without significant disease.  Extra-cardiac findings: See attached radiology report for non-cardiac structures.  IMPRESSION: 1. Coronary calcium score of 248. This was 63rd percentile for age-, sex, and race-matched controls.  2. Normal coronary origin with right dominance.  3. 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  4. Moderate LCX stenosis (50-69%).  5. Mild CAD (25-49%) in the LAD/RCA.  RECOMMENDATIONS: 1. Severe stenosis. (70-99% or > 50% left main). Cardiac catheterization is recommended. CT FFR will be submitted. Consider symptom-guided anti-ischemic pharmacotherapy as well as risk factor modification per guideline directed care. Invasive coronary angiography recommended with revascularization per published guideline statements.  Lennie Odor, MD   Electronically Signed By: Lennie Odor M.D. On: 01/01/2022 17:04  Narrative EXAM: OVER-READ INTERPRETATION  CT CHEST  The following report is an over-read performed by radiologist Dr. Richarda Overlie of [facility] on 01/01/2022. This over-read does not  include interpretation of cardiac or coronary anatomy or pathology. The coronary calcium score/coronary CTA interpretation by the cardiologist is attached.  COMPARISON:  Chest radiograph 12/31/2021  FINDINGS: Vascular: Normal caliber of the visualized thoracic aorta. No significant pericardial effusion. Focal thinning of the myocardium along the inferior aspect of the left ventricular apex. Please refer to the dedicated cardiology interpretation.  Mediastinum/Nodes: Visualized  mediastinal structures are normal.  Lungs/Pleura: Patchy dependent densities in the right lower lobe are most compatible with atelectasis and/or scarring. No large pleural effusions.  Upper Abdomen: High-density material in or around the liver on sequence 10, image 52 but could be related to the gallbladder but this area is incompletely evaluated.  Musculoskeletal: Bridging osteophytes in the lower thoracic spine.  IMPRESSION: 1. No acute extracardiac findings. 2. High-density material around the liver could be associated with the gallbladder but this area is incompletely evaluated. 3. Patchy densities along the dependent aspect of the right lower lobe are most compatible with atelectasis and/or scarring.  Electronically Signed: By: Richarda Overlie M.D. On: 01/01/2022 16:45           Current Reported Medications:.    Current Meds  Medication Sig   amLODipine (NORVASC) 2.5 MG tablet Take 1 tablet (2.5 mg total) by mouth daily. (Patient taking differently: Take 2.5 mg by mouth at bedtime.)   ammonium lactate (LAC-HYDRIN) 12 % lotion Apply 1 application. topically as needed for dry skin.   aspirin EC 81 MG tablet Take 81 mg by mouth in the morning. Swallow whole.   atenolol (TENORMIN) 25 MG tablet Take 0.5 tablets (12.5 mg total) by mouth daily. (Patient taking differently: Take 12.5 mg by mouth at bedtime.)   FLUoxetine (PROZAC) 20 MG capsule Take 20 mg by mouth in the morning.   hydrochlorothiazide (HYDRODIURIL) 25 MG tablet Take 25 mg by mouth at bedtime.   latanoprost (XALATAN) 0.005 % ophthalmic solution Place 1 drop into both eyes at bedtime.   memantine (NAMENDA) 10 MG tablet Take 10 mg by mouth in the morning.   metFORMIN (GLUCOPHAGE-XR) 500 MG 24 hr tablet Take 500 mg by mouth daily with breakfast.   naproxen (NAPROSYN) 500 MG tablet Take 500 mg by mouth 2 (two) times daily as needed for mild pain.   nitroGLYCERIN (NITROSTAT) 0.4 MG SL tablet Place 1 tablet (0.4 mg total)  under the tongue every 5 (five) minutes as needed for chest pain.   potassium chloride SA (KLOR-CON M) 20 MEQ tablet Take 20 mEq by mouth in the morning.   rosuvastatin (CRESTOR) 20 MG tablet Take 1 tablet (20 mg total) by mouth daily.   TYLENOL 8 HOUR ARTHRITIS PAIN 650 MG CR tablet Take 1,300 mg by mouth every 8 (eight) hours as needed for pain.    Physical Exam:    VS:  BP 134/74   Pulse 71   Ht 5' 4.5" (1.638 m)   Wt 154 lb (69.9 kg)   SpO2 93%   BMI 26.03 kg/m    Wt Readings from Last 3 Encounters:  07/11/23 154 lb (69.9 kg)  03/13/23 158 lb 8.2 oz (71.9 kg)  06/05/22 177 lb (80.3 kg)    GEN: Well nourished, well developed in no acute distress NECK: No JVD; No carotid bruits CARDIAC: RRR, no murmurs, rubs, gallops RESPIRATORY:  Clear to auscultation without rales, wheezing or rhonchi  ABDOMEN: Soft, non-tender, non-distended EXTREMITIES:  No edema; No acute deformity   Asessement and Plan:.    CAD: CTA showed coronary calcium score  of 248, this was 63rd percentile, she had moderate left main stenosis, moderate LCx stenosis and mild CAD in the LAD/RCA, CT FFR was negative although with CCTA concerning for left main disease greater than 50% cardiac catheterization was recommended.  Patient wished for conservative management without cardiac catheterization. Patient denies chest pain, noted one episode of shortness of breath, quickly resolved with rest. Encouraged continued activity throughout the day to maintain mobility.  Continue aspirin 81 mg daily, amlodipine 2.5 mg daily, atenolol 12.5 mg daily, hydrochlorothiazide 25 mg daily, Crestor 20 mg daily and sublingual nitroglycerin as needed.   CHB s/p PPM: Following syncopal episode in 02/2023, was found to be in complete heart block with new right bundle branch block and LAFB.  Underwent PPM placement with Dr. Ladona Ridgel on 03/10/2023. She notes occasional discomfort at pacer site, no evidence of wound or infection. She has follow up next  week with Dr. Ladona Ridgel.   Hypertension: Blood pressure well controlled today 134/74. Patients daughter reports blood pressure readings consistently around 130/80 at home. Continue antihypertensive regimen as noted above. If she needs intensification of therapy in the future, consider switching atenolol to carvedilol given advanced age.  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 Continue conservative approach.  Hyperlipidemia: Last lipid profile on 06/06/2022 indicated total cholesterol 123, triglycerides 170, HDL 59 and LDL 36. Check fasting lipid profile and LFTs. Continue rosuvastatin 20 mg daily pending lipid profile.   Disposition: F/u with Dr. Ladona Ridgel on 07/16/23 as scheduled. Follow up with Dr. Tenny Craw in 6 months or sooner if needed.   Signed, Rip Harbour, NP

## 2023-07-11 ENCOUNTER — Encounter: Payer: Self-pay | Admitting: Cardiology

## 2023-07-11 ENCOUNTER — Ambulatory Visit: Payer: Medicare HMO | Attending: Internal Medicine | Admitting: Cardiology

## 2023-07-11 ENCOUNTER — Other Ambulatory Visit: Payer: Self-pay | Admitting: *Deleted

## 2023-07-11 VITALS — BP 134/74 | HR 71 | Ht 64.5 in | Wt 154.0 lb

## 2023-07-11 DIAGNOSIS — I442 Atrioventricular block, complete: Secondary | ICD-10-CM

## 2023-07-11 DIAGNOSIS — E785 Hyperlipidemia, unspecified: Secondary | ICD-10-CM

## 2023-07-11 DIAGNOSIS — I10 Essential (primary) hypertension: Secondary | ICD-10-CM

## 2023-07-11 DIAGNOSIS — I2510 Atherosclerotic heart disease of native coronary artery without angina pectoris: Secondary | ICD-10-CM

## 2023-07-11 DIAGNOSIS — F0150 Vascular dementia without behavioral disturbance: Secondary | ICD-10-CM

## 2023-07-11 NOTE — Patient Instructions (Signed)
Medication Instructions:  Your physician recommends that you continue on your current medications as directed. Please refer to the Current Medication list given to you today.  *If you need a refill on your cardiac medications before your next appointment, please call your pharmacy*   Lab Work: TUESDAY WHEN YOU SEE DR. Ladona Ridgel, GO TO THE LAB: FASTING LIPID & LFT'S If you have labs (blood work) drawn today and your tests are completely normal, you will receive your results only by: MyChart Message (if you have MyChart) OR A paper copy in the mail If you have any lab test that is abnormal or we need to change your treatment, we will call you to review the results.   Testing/Procedures: Non ordered   Follow-Up: At [facility], you and your health needs are our priority.  As part of our continuing mission to provide you with exceptional heart care, we have created designated Provider Care Teams.  These Care Teams include your primary Cardiologist (physician) and Advanced Practice Providers (APPs -  Physician Assistants and Nurse Practitioners) who all work together to provide you with the care you need, when you need it.  We recommend signing up for the patient portal called "MyChart".  Sign up information is provided on this After Visit Summary.  MyChart is used to connect with patients for Virtual Visits (Telemedicine).  Patients are able to view lab/test results, encounter notes, upcoming appointments, etc.  Non-urgent messages can be sent to your provider as well.   To learn more about what you can do with MyChart, go to [email or web address].    Your next appointment:   6 month(s)  Provider:   Dietrich Pates, MD     Other Instructions

## 2023-07-16 ENCOUNTER — Ambulatory Visit: Payer: Medicare HMO | Attending: Internal Medicine | Admitting: Internal Medicine

## 2023-07-16 ENCOUNTER — Encounter: Payer: Self-pay | Admitting: Internal Medicine

## 2023-07-16 VITALS — BP 118/62 | HR 66 | Ht 64.5 in | Wt 154.0 lb

## 2023-07-16 DIAGNOSIS — I442 Atrioventricular block, complete: Secondary | ICD-10-CM

## 2023-07-16 LAB — CUP PACEART INCLINIC DEVICE CHECK
Battery Remaining Longevity: 100 mo
Battery Voltage: 2.98 V
Brady Statistic RA Percent Paced: 40 %
Brady Statistic RV Percent Paced: 99.91 %
Date Time Interrogation Session: 20241022140659
Implantable Lead Connection Status: 753985
Implantable Lead Connection Status: 753985
Implantable Lead Implant Date: 20240616
Implantable Lead Implant Date: 20240616
Implantable Lead Location: 753859
Implantable Lead Location: 753860
Implantable Pulse Generator Implant Date: 20240616
Lead Channel Impedance Value: 487.5 Ohm
Lead Channel Impedance Value: 550 Ohm
Lead Channel Pacing Threshold Amplitude: 0.75 V
Lead Channel Pacing Threshold Amplitude: 0.75 V
Lead Channel Pacing Threshold Amplitude: 0.75 V
Lead Channel Pacing Threshold Amplitude: 0.75 V
Lead Channel Pacing Threshold Pulse Width: 0.5 ms
Lead Channel Pacing Threshold Pulse Width: 0.5 ms
Lead Channel Pacing Threshold Pulse Width: 0.5 ms
Lead Channel Pacing Threshold Pulse Width: 0.5 ms
Lead Channel Sensing Intrinsic Amplitude: 12 mV
Lead Channel Sensing Intrinsic Amplitude: 4.2 mV
Lead Channel Setting Pacing Amplitude: 2 V
Lead Channel Setting Pacing Amplitude: 2.5 V
Lead Channel Setting Pacing Pulse Width: 0.5 ms
Lead Channel Setting Sensing Sensitivity: 4 mV
Pulse Gen Model: 2272
Pulse Gen Serial Number: 8185505

## 2023-07-16 LAB — HEPATIC FUNCTION PANEL
ALT: 17 [IU]/L (ref 0–32)
AST: 25 [IU]/L (ref 0–40)
Albumin: 4.1 g/dL (ref 3.7–4.7)
Alkaline Phosphatase: 61 [IU]/L (ref 44–121)
Bilirubin Total: 0.7 mg/dL (ref 0.0–1.2)
Bilirubin, Direct: 0.17 mg/dL (ref 0.00–0.40)
Total Protein: 7 g/dL (ref 6.0–8.5)

## 2023-07-16 LAB — LIPID PANEL
Chol/HDL Ratio: 1.9 {ratio} (ref 0.0–4.4)
Cholesterol, Total: 136 mg/dL (ref 100–199)
HDL: 70 mg/dL (ref >39)
LDL Chol Calc (NIH): 49 mg/dL (ref 0–99)
Triglycerides: 91 mg/dL (ref 0–149)
VLDL Cholesterol Cal: 17 mg/dL (ref 5–40)

## 2023-07-16 NOTE — Progress Notes (Signed)
HPI Jasmine Crawford returns today for followup. She has a h/o CHB s/p PPM insertion several months ago. She has been stable in the interim. She has mild class 2 dyspnea. No syncope or chest pain. She has mild peripheral edema.  Allergies  Allergen Reactions   Bacitracin Swelling and Other (See Comments)    "Swelling," but not swelling of the throat   Erythromycin Base Swelling and Other (See Comments)    "Swelling," but not swelling of the throat   Losartan Other (See Comments)    Reaction not recalled   Ramipril Other (See Comments)    Reaction not recalled      Current Outpatient Medications  Medication Sig Dispense Refill   ammonium lactate (LAC-HYDRIN) 12 % lotion Apply 1 application. topically as needed for dry skin.     aspirin EC 81 MG tablet Take 81 mg by mouth in the morning. Swallow whole.     atenolol (TENORMIN) 25 MG tablet Take 0.5 tablets (12.5 mg total) by mouth daily. (Patient taking differently: Take 12.5 mg by mouth at bedtime.) 45 tablet 3   FLUoxetine (PROZAC) 20 MG capsule Take 20 mg by mouth in the morning.     hydrochlorothiazide (HYDRODIURIL) 25 MG tablet Take 25 mg by mouth at bedtime.     latanoprost (XALATAN) 0.005 % ophthalmic solution Place 1 drop into both eyes at bedtime.     memantine (NAMENDA) 10 MG tablet Take 10 mg by mouth in the morning.     metFORMIN (GLUCOPHAGE-XR) 500 MG 24 hr tablet Take 500 mg by mouth daily with breakfast.     naproxen (NAPROSYN) 500 MG tablet Take 500 mg by mouth 2 (two) times daily as needed for mild pain.     potassium chloride SA (KLOR-CON M) 20 MEQ tablet Take 20 mEq by mouth in the morning.     TYLENOL 8 HOUR ARTHRITIS PAIN 650 MG CR tablet Take 1,300 mg by mouth every 8 (eight) hours as needed for pain.     albuterol (VENTOLIN HFA) 108 (90 Base) MCG/ACT inhaler Inhale 1-2 puffs into the lungs every 6 (six) hours as needed for wheezing or shortness of breath. (Patient not taking: Reported on 07/11/2023)      amLODipine (NORVASC) 2.5 MG tablet Take 1 tablet (2.5 mg total) by mouth daily. (Patient taking differently: Take 2.5 mg by mouth at bedtime.) 90 tablet 3   fluticasone (FLONASE) 50 MCG/ACT nasal spray Place 1 spray into both nostrils 2 (two) times daily as needed for allergies or rhinitis. (Patient not taking: Reported on 07/11/2023)     nitroGLYCERIN (NITROSTAT) 0.4 MG SL tablet Place 1 tablet (0.4 mg total) under the tongue every 5 (five) minutes as needed for chest pain. 90 tablet 3   rosuvastatin (CRESTOR) 20 MG tablet Take 1 tablet (20 mg total) by mouth daily. 30 tablet 0   No current facility-administered medications for this visit.     Past Medical History:  Diagnosis Date   Arthritis    Blind left eye    Diabetes (HCC)    Hyperlipidemia    Hypertension    Hypothyroid    PAD (peripheral artery disease) (HCC)     ROS:   All systems reviewed and negative except as noted in the HPI.   Past Surgical History:  Procedure Laterality Date   EYE SURGERY Left    had glaucoma   PACEMAKER IMPLANT N/A 03/10/2023   Procedure: PACEMAKER IMPLANT;  Surgeon: Marinus Maw, MD;  Location: [facility];  Service: Cardiovascular;  Laterality: N/A;     History reviewed. No pertinent family history.   Social History   Socioeconomic History   Marital status: Widowed    Spouse name: Not on file   Number of children: Not on file   Years of education: Not on file   Highest education level: Not on file  Occupational History   Not on file  Tobacco Use   Smoking status: Never   Smokeless tobacco: Never  Vaping Use   Vaping status: Never Used  Substance and Sexual Activity   Alcohol use: Never   Drug use: Never   Sexual activity: Never  Other Topics Concern   Not on file  Social History Narrative   Not on file   Social Determinants of Health   Financial Resource Strain: Low Risk  (11/06/2022)   Received from [facility], [facility]   Overall Financial  Resource Strain (CARDIA)    Difficulty of Paying Living Expenses: Not hard at all  Food Insecurity: Food Insecurity Present (03/08/2023)   Hunger Vital Sign    Worried About Running Out of Food in the Last Year: Sometimes true    Ran Out of Food in the Last Year: Sometimes true  Transportation Needs: No Transportation Needs (03/08/2023)   PRAPARE - Administrator, Civil Service (Medical): No    Lack of Transportation (Non-Medical): No  Physical Activity: Insufficiently Active (08/04/2020)   Received from [facility], [facility]   Exercise Vital Sign    Days of Exercise per Week: 5 days    Minutes of Exercise per Session: 10 min  Stress: Not on file  Social Connections: Not on file  Intimate Partner Violence: Not At Risk (03/08/2023)   Humiliation, Afraid, Rape, and Kick questionnaire    Fear of Current or Ex-Partner: No    Emotionally Abused: No    Physically Abused: No    Sexually Abused: No     BP 118/62   Pulse 66   Ht 5' 4.5" (1.638 m)   Wt 154 lb (69.9 kg)   SpO2 98%   BMI 26.03 kg/m   Physical Exam:  Well appearing NAD HEENT: Unremarkable Neck:  No JVD, no thyromegally Lymphatics:  No adenopathy Back:  No CVA tenderness Lungs:  Clear HEART:  Regular rate rhythm, no murmurs, no rubs, no clicks Abd:  soft, positive bowel sounds, no organomegally, no rebound, no guarding Ext:  2 plus pulses, no edema, no cyanosis, no clubbing Skin:  No rashes no nodules Neuro:  CN II through XII intact, motor grossly intact  EKG - nsr with ventricular pacing  DEVICE  Normal device function.  See PaceArt for details.   Assess/Plan: CHB - she is s/p PPM and is doing well.  PPM - her [employer] DDD PM is working normally.  Sharlot Gowda Kevonte Vanecek,MD

## 2023-07-16 NOTE — Patient Instructions (Signed)

## 2023-07-17 NOTE — Progress Notes (Deleted)
Spoke with patient's daughter. Lab results relayed and no questions at this time.

## 2023-08-15 IMAGING — NM NM PULMONARY PERF PARTICULATE
8 series · 8 of 8 positions shown · non-contrast
Comparison: Chest x-ray 01/02/2022

CLINICAL DATA: 80-year-old female with nonspecific chest pain

EXAM:
NUCLEAR MEDICINE PERFUSION LUNG SCAN
TECHNIQUE: Perfusion images were obtained in multiple projections after
intravenous injection of radiopharmaceutical.
Ventilation scans intentionally deferred if perfusion scan and chest
x-ray adequate for interpretation during COVID 19 epidemic.
RADIOPHARMACEUTICALS:  Four mCi Jc-EEm MAA IV

[Series 1: ant/post perf · 4.14mm/px · 1 of 1 slices shown (1 of 2)]
[im 1/1]
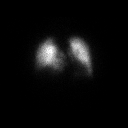

[Series 1: ant/post perf · 4.14mm/px · 1 of 1 slices shown (2 of 2)]
[im 1/1]
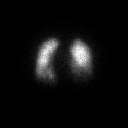

[Series 2: lao/rpo perf · 4.14mm/px · 1 of 1 slices shown (1 of 2)]
[im 1/1]
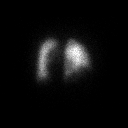

[Series 2: lao/rpo perf · 4.14mm/px · 1 of 1 slices shown (2 of 2)]
[im 1/1]
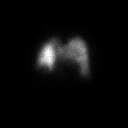

[Series 3: lpo/rao perf · 4.14mm/px · 1 of 1 slices shown (1 of 2)]
[im 1/1]
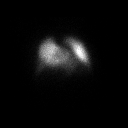

[Series 3: lpo/rao perf · 4.14mm/px · 1 of 1 slices shown (2 of 2)]
[im 1/1]
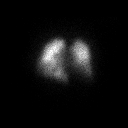

[Series 4: lt lat/rt lat perf · 4.14mm/px · 1 of 1 slices shown (1 of 2)]
[im 1/1]
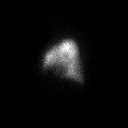

[Series 4: lt lat/rt lat perf · 4.14mm/px · 1 of 1 slices shown (2 of 2)]
[im 1/1]
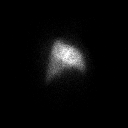

[8 of 8 positions shown; findings below may reference images not displayed]

FINDINGS: Planar scintigraphic images of the chest performed in multiple
projection.

No large perfusion defects of the left or right lung identified.
IMPRESSION: Negative perfusion scan for evidence of pulmonary embolism.

## 2023-10-21 ENCOUNTER — Emergency Department (HOSPITAL_COMMUNITY): Payer: Medicare HMO

## 2023-10-21 ENCOUNTER — Emergency Department (HOSPITAL_COMMUNITY)
Admission: EM | Admit: 2023-10-21 | Discharge: 2023-10-21 | Disposition: A | Discharge status: Home / Self Care | Payer: Medicare HMO | Attending: Emergency Medicine | Admitting: Emergency Medicine

## 2023-10-21 ENCOUNTER — Other Ambulatory Visit: Payer: Self-pay

## 2023-10-21 DIAGNOSIS — Z7982 Long term (current) use of aspirin: Secondary | ICD-10-CM | POA: Insufficient documentation

## 2023-10-21 DIAGNOSIS — Z7984 Long term (current) use of oral hypoglycemic drugs: Secondary | ICD-10-CM | POA: Insufficient documentation

## 2023-10-21 DIAGNOSIS — M25551 Pain in right hip: Secondary | ICD-10-CM | POA: Diagnosis present

## 2023-10-21 DIAGNOSIS — F0390 Unspecified dementia without behavioral disturbance: Secondary | ICD-10-CM | POA: Insufficient documentation

## 2023-10-21 DIAGNOSIS — W19XXXA Unspecified fall, initial encounter: Secondary | ICD-10-CM

## 2023-10-21 DIAGNOSIS — E119 Type 2 diabetes mellitus without complications: Secondary | ICD-10-CM | POA: Diagnosis not present

## 2023-10-21 DIAGNOSIS — W07XXXA Fall from chair, initial encounter: Secondary | ICD-10-CM | POA: Diagnosis not present

## 2023-10-21 DIAGNOSIS — Y92009 Unspecified place in unspecified non-institutional (private) residence as the place of occurrence of the external cause: Secondary | ICD-10-CM | POA: Diagnosis not present

## 2023-10-21 DIAGNOSIS — Z950 Presence of cardiac pacemaker: Secondary | ICD-10-CM | POA: Insufficient documentation

## 2023-10-21 DIAGNOSIS — I10 Essential (primary) hypertension: Secondary | ICD-10-CM | POA: Insufficient documentation

## 2023-10-21 LAB — COMPREHENSIVE METABOLIC PANEL
ALT: 16 U/L (ref 0–44)
AST: 31 U/L (ref 15–41)
Albumin: 3.9 g/dL (ref 3.5–5.0)
Alkaline Phosphatase: 57 U/L (ref 38–126)
Anion gap: 12 (ref 5–15)
BUN: 14 mg/dL (ref 8–23)
CO2: 26 mmol/L (ref 22–32)
Calcium: 10 mg/dL (ref 8.9–10.3)
Chloride: 101 mmol/L (ref 98–111)
Creatinine, Ser: 0.86 mg/dL (ref 0.44–1.00)
GFR, Estimated: >60 mL/min (ref >60)
Glucose, Bld: 169 mg/dL — ABNORMAL HIGH (ref 70–99)
Potassium: 4 mmol/L (ref 3.5–5.1)
Sodium: 139 mmol/L (ref 135–145)
Total Bilirubin: 1.7 mg/dL — ABNORMAL HIGH (ref 0.0–1.2)
Total Protein: 7.3 g/dL (ref 6.5–8.1)

## 2023-10-21 NOTE — Discharge Instructions (Signed)
Jasmine Crawford was seen in the emergency department after a fall The CAT scan taken of your head and neck looked okay The chest x-ray and hip x-ray did not show any broken bones She was able to walk here without issue Her blood work looked okay today She should follow-up with her primary care doctor within 1 week for reevaluation Continue taking all previous prescribed indications Return to the emergency department for repeated falls or any other concerns

## 2023-10-21 NOTE — ED Notes (Signed)
Patient family member here to accompany patient. Family member updated on plan of care and I escorted them both to the lobby.

## 2023-10-21 NOTE — ED Provider Triage Note (Signed)
Emergency Medicine Provider Triage Evaluation Note  Jasmine Crawford , a 83 y.o. female  was evaluated in triage.  Pt complains of a fall today at home.  Patient's grandson reports that she was dizzy and fell but patient reports she does not feel like she was dizzy.  However she cannot describe well why she fell.  She reports a mild headache now and some pain in her right hip but denies feeling dizzy or having chest pain or palpitations.  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  Review of Systems  Positive: Fall, right hip pain, possible dizziness, headache Negative: Chest pain, shortness of breath, nausea or vomiting  Physical Exam  BP 121/78 (BP Location: Right Arm)   Pulse 73   Temp 98.2 F (36.8 C) (Oral)   Resp 18   SpO2 100%  Gen:   Awake, no distress   Resp:  Normal effort  MSK:   Moves extremities without difficulty able to move her legs without difficulty but pain to the right hip Other:  No abdominal pain, cardiac exam with a regular rhythm  Medical Decision Making  Medically screening exam initiated at 2:39 PM.  Appropriate orders placed.  Caycee Wanat was informed that the remainder of the evaluation will be completed by another provider, this initial triage assessment does not replace that evaluation, and the importance of remaining in the ED until their evaluation is complete.  Patient with a fall today.  Imaging for injury pending.  She has an Abbott dual chamber pacemaker that will need interrogation.   Gwyneth Sprout, MD 10/21/23 [phone]

## 2023-10-21 NOTE — ED Triage Notes (Signed)
Patient with hx of dementia had unwitnessed fall approximately one hour ago. RLE pain from same. PMS intact and no obvious injury per EMS.

## 2023-10-22 NOTE — ED Provider Notes (Signed)
[place] EMERGENCY DEPARTMENT AT [facility] Provider Note   CSN: [id number] Arrival date & time: 10/21/23  1428     History  Chief Complaint  Patient presents with   Jasmine Crawford is a 83 y.o. female.  With a history of diabetes, hypertension, hyperlipidemia and pacemaker who presents to the ED after fall.  Patient suffered an unwitnessed fall at home today.  She reports falling while attempting to get up out of her chair.  She did not walk after the incident.  No loss of consciousness but uncertain if there was head trauma.  Since the time of her fall she has reported persistent right hip pain but does note she has chronic hip pain related to arthritis.  Denies headaches neck pain chest pain shortness of breath domino pain pain in other extremities.  No anticoagulation.  Patient lives with her daughter and grandson who helps care for her.  She ambulates with a walker   Fall       Home Medications Prior to Admission medications   Medication Sig Start Date End Date Taking? Authorizing Provider  albuterol (VENTOLIN HFA) 108 (90 Base) MCG/ACT inhaler Inhale 1-2 puffs into the lungs every 6 (six) hours as needed for wheezing or shortness of breath. Patient not taking: Reported on 07/11/2023    [provider]  amLODipine (NORVASC) 2.5 MG tablet Take 1 tablet (2.5 mg total) by mouth daily. Patient taking differently: Take 2.5 mg by mouth at bedtime. 01/29/23 07/11/23  Pricilla Riffle, MD  ammonium lactate (LAC-HYDRIN) 12 % lotion Apply 1 application. topically as needed for dry skin.    [provider]  aspirin EC 81 MG tablet Take 81 mg by mouth in the morning. Swallow whole.    [provider]  atenolol (TENORMIN) 25 MG tablet Take 0.5 tablets (12.5 mg total) by mouth daily. Patient taking differently: Take 12.5 mg by mouth at bedtime. 01/29/23   Pricilla Riffle, MD  FLUoxetine (PROZAC) 20 MG capsule Take 20 mg by mouth in the morning.     [provider]  fluticasone (FLONASE) 50 MCG/ACT nasal spray Place 1 spray into both nostrils 2 (two) times daily as needed for allergies or rhinitis. Patient not taking: Reported on 07/11/2023    [provider]  hydrochlorothiazide (HYDRODIURIL) 25 MG tablet Take 25 mg by mouth at bedtime.    [provider]  latanoprost (XALATAN) 0.005 % ophthalmic solution Place 1 drop into both eyes at bedtime.    [provider]  memantine (NAMENDA) 10 MG tablet Take 10 mg by mouth in the morning.    [provider]  metFORMIN (GLUCOPHAGE-XR) 500 MG 24 hr tablet Take 500 mg by mouth daily with breakfast.    [provider]  naproxen (NAPROSYN) 500 MG tablet Take 500 mg by mouth 2 (two) times daily as needed for mild pain.    [provider]  nitroGLYCERIN (NITROSTAT) 0.4 MG SL tablet Place 1 tablet (0.4 mg total) under the tongue every 5 (five) minutes as needed for chest pain. 01/24/22 07/11/23  Dyann Kief, PA-C  potassium chloride SA (KLOR-CON M) 20 MEQ tablet Take 20 mEq by mouth in the morning.    [provider]  rosuvastatin (CRESTOR) 20 MG tablet Take 1 tablet (20 mg total) by mouth daily. 01/03/22 07/11/23  Arrien, York Ram, MD  TYLENOL 8 HOUR ARTHRITIS PAIN 650 MG CR tablet Take 1,300 mg by mouth every 8 (eight)  hours as needed for pain.    [provider]      Allergies    Bacitracin, Erythromycin base, Losartan, and Ramipril    Review of Systems   Review of Systems  Physical Exam Updated Vital Signs BP (!) 155/69   Pulse 65   Temp 98.4 F (36.9 C) (Oral)   Resp 15   SpO2 99%  Physical Exam Vitals and nursing note reviewed.  HENT:     Head: Normocephalic and atraumatic.  Eyes:     Pupils: Pupils are equal, round, and reactive to light.  Cardiovascular:     Rate and Rhythm: Normal rate and regular rhythm.  Pulmonary:     Effort: Pulmonary effort is normal.     Breath sounds: Normal breath  sounds.  Abdominal:     Palpations: Abdomen is soft.     Tenderness: There is no abdominal tenderness.  Musculoskeletal:     Cervical back: Neck supple. No tenderness.     Comments: Tenderness to palpation of right anterior hip without deformity or rotation/shortening of right lower extremity Full active range of motion with flexion at the hip and knee bilateral lower extremities Flocked range of motion bilateral upper extremities No step-offs deformities or midline tenderness of back   Skin:    General: Skin is warm and dry.  Neurological:     Mental Status: She is alert.  Psychiatric:        Mood and Affect: Mood normal.     ED Results / Procedures / Treatments   Labs (all labs ordered are listed, but only abnormal results are displayed) Labs Reviewed  COMPREHENSIVE METABOLIC PANEL - Abnormal; Notable for the following components:      Result Value   Glucose, Bld 169 (*)    Total Bilirubin 1.7 (*)    All other components within normal limits  CBC WITH DIFFERENTIAL/PLATELET  CBC WITH DIFFERENTIAL/PLATELET    EKG EKG Interpretation Date/Time:  Monday October 21 2023 14:43:54 EST Ventricular Rate:  73 PR Interval:  172 QRS Duration:  122 QT Interval:  438 QTC Calculation: 482 R Axis:   -29  Text Interpretation: Atrial-sensed ventricular-paced rhythm No significant change since last tracing When compared with ECG of 11-Jul-2023 11:41, PREVIOUS ECG IS PRESENT Confirmed by Gwyneth Sprout ([id number]) on 10/21/2023 2:53:37 PM  Radiology CT Head Wo Contrast Result Date: 10/21/2023 CLINICAL DATA:  Headache, new onset (Age >= 51y). EXAM: CT HEAD WITHOUT CONTRAST TECHNIQUE: Contiguous axial images were obtained from the base of the skull through the vertex without intravenous contrast. RADIATION DOSE REDUCTION: This exam was performed according to the departmental dose-optimization program which includes automated exposure control, adjustment of the mA and/or kV according to  patient size and/or use of iterative reconstruction technique. COMPARISON:  CTA head and neck 12/31/2021 FINDINGS: Brain: There is no evidence of an acute infarct, intracranial hemorrhage, mass, midline shift, or extra-axial fluid collection. Patchy to confluent hypodensities in the cerebral white matter are unchanged and nonspecific but compatible with moderate to severe chronic small vessel ischemic disease. There is mild-to-moderate diffuse ventriculomegaly with normal size of the sulci, unchanged. Vascular: Calcified atherosclerosis at the skull base. No hyperdense vessel. Skull: No acute fracture or suspicious osseous lesion. Sinuses/Orbits: Visualized paranasal sinuses and mastoid air cells are clear. Bilateral cataract extraction. Other: None. IMPRESSION: 1. No evidence of acute intracranial abnormality. 2. Moderate to severe chronic small vessel ischemic disease. 3. Unchanged ventriculomegaly which could reflect central predominant cerebral atrophy or chronic communicating hydrocephalus Electronically  Signed   By: Sebastian Ache M.D.   On: 10/21/2023 17:48   DG Hip Unilat W or Wo Pelvis 2-3 Views Right Result Date: 10/21/2023 CLINICAL DATA:  Pain status post fall. EXAM: DG HIP (WITH OR WITHOUT PELVIS) 2-3V RIGHT COMPARISON:  None Available. FINDINGS: Diffuse osseous demineralization. No acute osseous abnormality. Femoral heads are seated within the acetabula. The sacroiliac joints and pubic symphysis are anatomically aligned. Mild-to-moderate degenerative changes of the bilateral hips. Vascular calcifications are noted. IMPRESSION: 1. No acute osseous abnormality. 2. 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 Electronically Signed   By: Hart Robinsons M.D.   On: 10/21/2023 15:36   DG Chest 1 View Result Date: 10/21/2023 CLINICAL DATA:  Fall. EXAM: CHEST  1 VIEW COMPARISON:  Chest radiograph dated March 11, 2023. FINDINGS: The heart size and mediastinal contours are within normal limits.  Stable left subclavian AICD. Aortic atherosclerosis. No focal consolidation, pleural effusion, or pneumothorax. Diffuse osseous demineralization. No acute osseous abnormality identified. IMPRESSION: No acute findings in the chest. Electronically Signed   By: Hart Robinsons M.D.   On: 10/21/2023 15:31    Procedures Procedures    Medications Ordered in ED Medications - No data to display  ED Course/ Medical Decision Making/ A&P                                 Medical Decision Making 83 year old female with history as above presenting via EMS after unwitnessed fall at home.  Some right anterior hip tenderness without deformity.  Good active range of motion and no other evidence of trauma.  CT head and neck negative for traumatic injury.  No acute findings on chest or pelvis x-rays.  No significant metabolic disturbance on metabolic panel and no evidence of dysrhythmia on her EKG.  Upon reassessment I ambulated the patient myself and she was able to walk with some support with a strong steady gait.  Her daughter feels comfortable taking her back home and she will follow-up with her PCP           Final Clinical Impression(s) / ED Diagnoses Final diagnoses:  Fall, initial encounter  Right hip pain    Rx / DC Orders ED Discharge Orders     None         Royanne Foots, DO 10/22/23 [phone]

## 2024-06-08 ENCOUNTER — Ambulatory Visit (INDEPENDENT_AMBULATORY_CARE_PROVIDER_SITE_OTHER): Payer: Medicare HMO

## 2024-06-08 DIAGNOSIS — I442 Atrioventricular block, complete: Secondary | ICD-10-CM

## 2024-06-10 ENCOUNTER — Telehealth: Payer: Self-pay

## 2024-06-10 LAB — CUP PACEART REMOTE DEVICE CHECK
Battery Remaining Longevity: 89 mo
Battery Remaining Percentage: 87 %
Battery Voltage: 2.99 V
Brady Statistic AP VP Percent: 34 %
Brady Statistic AP VS Percent: 1 %
Brady Statistic AS VP Percent: 66 %
Brady Statistic AS VS Percent: 1 %
Brady Statistic RA Percent Paced: 33 %
Brady Statistic RV Percent Paced: 99 %
Date Time Interrogation Session: 20250917105552
Implantable Lead Connection Status: 753985
Implantable Lead Connection Status: 753985
Implantable Lead Implant Date: 20240616
Implantable Lead Implant Date: 20240616
Implantable Lead Location: 753859
Implantable Lead Location: 753860
Implantable Pulse Generator Implant Date: 20240616
Lead Channel Impedance Value: 450 Ohm
Lead Channel Impedance Value: 450 Ohm
Lead Channel Pacing Threshold Amplitude: 0.75 V
Lead Channel Pacing Threshold Amplitude: 0.75 V
Lead Channel Pacing Threshold Pulse Width: 0.5 ms
Lead Channel Pacing Threshold Pulse Width: 0.5 ms
Lead Channel Sensing Intrinsic Amplitude: 3.1 mV
Lead Channel Sensing Intrinsic Amplitude: 9.6 mV
Lead Channel Setting Pacing Amplitude: 2 V
Lead Channel Setting Pacing Amplitude: 2.5 V
Lead Channel Setting Pacing Pulse Width: 0.5 ms
Lead Channel Setting Sensing Sensitivity: 4 mV
Pulse Gen Model: 2272
Pulse Gen Serial Number: 8185505

## 2024-06-10 NOTE — Telephone Encounter (Signed)
 PPM Scheduled remote reviewed. Normal device function.  Presenting rhythm:  AS/VP 24 AMS EGM's, longest duration 3hrs , rates controlled, burden <1% since 07/16/23, no hx of AF per EPIC   _______________________________________________________________________  Device alert received for new finding of AF. AT/AF burden <1%. No OAC in EPIC. Longest duration 3hrs 18 minutes on 05/16/2024. Rate controlled. CHAD2DS-VASC score of at least 3.   Spoke w/ patient daughter regarding recent device alert for new AF finding. Patient unavailable and cannot assess symptoms at this time. Daughter states she will have patient call device clinic back.   Will forward to provider for awareness and update accordingly.

## 2024-06-11 NOTE — Telephone Encounter (Signed)
 Attempted to call patient/daughter number, no answer and unable to LM on VM because it was full.

## 2024-06-12 ENCOUNTER — Ambulatory Visit: Payer: Self-pay | Admitting: Internal Medicine

## 2024-06-12 NOTE — Telephone Encounter (Signed)
 Pt is due for follow up.  Message sent to scheduling to schedule next available with GT or EP APP.

## 2024-06-15 NOTE — Progress Notes (Signed)
 Remote PPM Transmission

## 2024-06-24 NOTE — Telephone Encounter (Signed)
 Spoke w/ patients daughter Lacey - patient is scheduled on 10/22 w/ Dr. Waddell!

## 2024-07-10 ENCOUNTER — Encounter: Admitting: Internal Medicine

## 2024-07-15 ENCOUNTER — Ambulatory Visit: Admitting: Internal Medicine

## 2024-08-27 ENCOUNTER — Ambulatory Visit: Admitting: Internal Medicine

## 2024-09-02 ENCOUNTER — Ambulatory Visit: Admitting: Cardiology

## 2024-09-05 ENCOUNTER — Encounter: Payer: Self-pay | Admitting: Cardiology

## 2024-09-05 NOTE — Progress Notes (Unsigned)
 Electrophysiology Clinic Note    Date:  09/07/2024  Patient ID:  Jasmine Crawford, Jasmine Crawford 03-03-1941, MRN [id number] PCP:  Myrna Kay, MD  Cardiologist:  Vina Gull, MD  Electrophysiologist:  Danelle Birmingham, MD  Electrophysiology APP:  Jacere Pangborn, NP    Discussed the use of AI scribe software for clinical note transcription with the patient, who gave verbal consent to proceed.   Patient Profile    Chief Complaint: PPM follow-up  History of Present Illness: Jasmine Crawford is a 83 y.o. female with PMH notable for CHB s/p PPM, non-obs CAD, HTN, HLD, PAD, T2DM, vasc dementia; seen today for Danelle Birmingham, MD for routine electrophysiology followup.   She last saw Dr. Birmingham 06/2023 for routine 67-month follow-up after pacemaker implant.  She was doing well at that time pacemaker interrogation. Device clinic was alerted to A-fib episode lasting 3 hours on pacemaker 05/2024, and appt was scheduled to discuss which patient has cancelled/rescheduled many times.   Today, she has no complaints. Daughter joins for visit who contributes all HPI. She confirms patient has no acute concerns. She denies concerns with PPM. She denies chest pain, chest pressure, palpitations.   She does have ongoing bilateral lower extremity swelling, planning additional testing to further eval. The swelling is not new or worse than normal.  She had not taken AM medications today. They do not check BP regularly at home     Arrhythmia/Device History St. Jude dual chamber PPM, imp 02/2023; dx CHB    ROS:  Please see the history of present illness. All other systems are reviewed and otherwise negative.    Physical Exam    VS:  BP (!) 160/100 (BP Location: Left Arm, Patient Position: Sitting, Cuff Size: Normal)   Pulse 78   Ht 5' 5 (1.651 m)   Wt 176 lb 6.4 oz (80 kg)   SpO2 97%   BMI 29.35 kg/m  BMI: Body mass index is 29.35 kg/m.           Wt Readings from Last 3 Encounters:  09/07/24 176 lb 6.4 oz  (80 kg)  07/16/23 154 lb (69.9 kg)  07/11/23 154 lb (69.9 kg)     GEN- The patient is well appearing, alert and oriented x 3 today.   Lungs- Clear to ausculation bilaterally, normal work of breathing.  Heart- Regular rate and rhythm, no murmurs, rubs or gallops Extremities- 1-2+ peripheral edema, warm, dry Skin-  device pocket well-healed, no tethering  Device interrogation done today and reviewed by myself:  Battery 8.2 years Lead thresholds, impedence, sensing stable  Dependent down to VVI 40 Several AF episodes, longest ~3 hours Turned ON autocapture of RV   Studies Reviewed   Previous EP, cardiology notes.    EKG is ordered. Personal review of EKG from today shows:    EKG Interpretation Date/Time:  Monday September 07 2024 09:24:10 EST Ventricular Rate:  78 PR Interval:  168 QRS Duration:  128 QT Interval:  414 QTC Calculation: 471 R Axis:   -53  Text Interpretation: Atrial-sensed ventricular-paced rhythm Confirmed by Allan Minotti [phone]) on 09/07/2024 9:28:37 AM    TTE, 03/08/2023  1. Left ventricular ejection fraction, by estimation, is 55%. The left ventricle has normal function. The left ventricle has no regional wall motion abnormalities. Left ventricular diastolic parameters are indeterminate.   2. Right ventricular systolic function is moderately reduced. The right ventricular size is moderately enlarged.   3. Left atrial size was moderately dilated.   4.  Right atrial size was moderately dilated.   5. The mitral valve is abnormal. Mild mitral valve regurgitation. No evidence of mitral stenosis. Moderate mitral annular calcification.   6. The aortic valve is tricuspid. There is moderate calcification of the aortic valve. There is moderate thickening of the aortic valve. Aortic valve regurgitation is not visualized. Aortic valve sclerosis is present, with no evidence of aortic valve stenosis.   7. The inferior vena cava is normal in size with greater than 50%  respiratory variability, suggesting right atrial pressure of 3 mmHg.   Coronary CT, 01/01/2022 1. Coronary calcium  score of 248. This was 63rd percentile for age-, sex, and race-matched controls.  2. Normal coronary origin with right dominance.  3. 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  4. Moderate LCX stenosis (50-69%).  5. Mild CAD (25-49%) in the LAD/RCA.   Assessment and Plan     #) CHB s/p PPM Device functioning well, see paceart for details Stable lead measurements Dependent Battery good AF episodes noted, see below   #) parox AFib Parox AF episodes noted on device interrogation, patient asymptomatic of episodes Overall burden < 1% Ventricular rate well-controlled during episodes  #) Hypercoag d/t subclinical afib CHA2DS2-VASc Score = at least 6 [CHF History: 0, HTN History: 1, Diabetes History: 1, Stroke History: 0, Vascular Disease History: 1, Age Score: 2, Gender Score: 1].  Therefore, the patient's annual risk of stroke is 9.7 %.    Stroke ppx - will start 5mg  eliquis  BID Update BMP, CBC today Recommended patient and daughter to monitor for bleeding concerns Stop 81mg  asa, stop naproxen. Avoid NSAID usage, ok to use tylenol  PRN  #) HTN Quite elevated in office today, patient has not taken medications this morning Recommended to check BP more regularly at home and record measurements Continue 2.5mg  amlodipine , 25mg  hydrochlorothiazide , and 12.5mg  atenolol  Notify PCP if BP readings continue to be elevated       Current medicines are reviewed at length with the patient today.   The patient does not have concerns regarding her medicines.  The following changes were made today:   STOP aspirin  and naproxen START 5mg  eliquis  twice a day  Labs/ tests ordered today include:  Orders Placed This Encounter  Procedures   Basic metabolic panel with GFR   CBC   EKG 12-Lead     Disposition: Follow up with EP Team or EP APP in 4 weeks   Signed, Chantal Needle, NP   09/07/2024  10:09 AM  Electrophysiology [facility]

## 2024-09-07 ENCOUNTER — Ambulatory Visit: Payer: Medicare HMO

## 2024-09-07 ENCOUNTER — Ambulatory Visit: Attending: Cardiology | Admitting: Cardiology

## 2024-09-07 ENCOUNTER — Telehealth (HOSPITAL_COMMUNITY): Payer: Self-pay

## 2024-09-07 ENCOUNTER — Encounter: Payer: Self-pay | Admitting: Cardiology

## 2024-09-07 ENCOUNTER — Other Ambulatory Visit (HOSPITAL_COMMUNITY): Payer: Self-pay

## 2024-09-07 VITALS — BP 160/100 | HR 78 | Ht 65.0 in | Wt 176.4 lb

## 2024-09-07 DIAGNOSIS — Z950 Presence of cardiac pacemaker: Secondary | ICD-10-CM

## 2024-09-07 DIAGNOSIS — D6869 Other thrombophilia: Secondary | ICD-10-CM

## 2024-09-07 DIAGNOSIS — I10 Essential (primary) hypertension: Secondary | ICD-10-CM

## 2024-09-07 DIAGNOSIS — I442 Atrioventricular block, complete: Secondary | ICD-10-CM

## 2024-09-07 DIAGNOSIS — I480 Paroxysmal atrial fibrillation: Secondary | ICD-10-CM

## 2024-09-07 LAB — CUP PACEART INCLINIC DEVICE CHECK
Battery Remaining Longevity: 100 mo
Battery Voltage: 2.99 V
Brady Statistic RA Percent Paced: 31 %
Brady Statistic RV Percent Paced: 99.89 %
Date Time Interrogation Session: 20251215102553
Implantable Lead Connection Status: 753985
Implantable Lead Connection Status: 753985
Implantable Lead Implant Date: 20240616
Implantable Lead Implant Date: 20240616
Implantable Lead Location: 753859
Implantable Lead Location: 753860
Implantable Pulse Generator Implant Date: 20240616
Lead Channel Impedance Value: 450 Ohm
Lead Channel Impedance Value: 462.5 Ohm
Lead Channel Pacing Threshold Amplitude: 0.5 V
Lead Channel Pacing Threshold Amplitude: 0.5 V
Lead Channel Pacing Threshold Amplitude: 0.75 V
Lead Channel Pacing Threshold Amplitude: 0.75 V
Lead Channel Pacing Threshold Pulse Width: 0.5 ms
Lead Channel Pacing Threshold Pulse Width: 0.5 ms
Lead Channel Pacing Threshold Pulse Width: 0.5 ms
Lead Channel Pacing Threshold Pulse Width: 0.5 ms
Lead Channel Sensing Intrinsic Amplitude: 3.8 mV
Lead Channel Sensing Intrinsic Amplitude: 9.6 mV
Lead Channel Setting Pacing Amplitude: 1 V
Lead Channel Setting Pacing Amplitude: 2 V
Lead Channel Setting Pacing Pulse Width: 0.5 ms
Lead Channel Setting Sensing Sensitivity: 4 mV
Pulse Gen Model: 2272
Pulse Gen Serial Number: 8185505

## 2024-09-07 MED ORDER — APIXABAN 5 MG PO TABS: 5.0000 mg | ORAL_TABLET | Freq: Two times a day (BID) | ORAL | 3 refills | Status: AC

## 2024-09-07 MED ORDER — APIXABAN 5 MG PO TABS: 5.0000 mg | ORAL_TABLET | Freq: Two times a day (BID) | ORAL | 3 refills | Status: DC

## 2024-09-07 MED ORDER — APIXABAN 5 MG PO TABS: 5.0000 mg | ORAL_TABLET | Freq: Two times a day (BID) | ORAL | Status: DC

## 2024-09-07 NOTE — Addendum Note (Signed)
 Addended by: Sonnie Pawloski A on: 09/07/2024 10:35 AM   Modules accepted: Orders

## 2024-09-07 NOTE — Patient Instructions (Signed)
 Medication Instructions:  Your physician recommends the following medication changes.  STOP TAKING: Aspirin  and Aleve.  START TAKING: Eliquis  5 mg twice a day.   *If you need a refill on your cardiac medications before your next appointment, please call your pharmacy*  Lab Work: Your provider would like for you to have following labs drawn today BMP and CBC.    Testing/Procedures: No test ordered today   Follow-Up:  Home Blood Pressure Log.   At [facility], you and your health needs are our priority.  As part of our continuing mission to provide you with exceptional heart care, our providers are all part of one team.  This team includes your primary Cardiologist (physician) and Advanced Practice Providers or APPs (Physician Assistants and Nurse Practitioners) who all work together to provide you with the care you need, when you need it.  Your next appointment:   4 to 6 week(s)  Provider:

## 2024-09-07 NOTE — Telephone Encounter (Signed)
 Pharmacy Patient Advocate Encounter  Insurance verification completed.    The patient is insured through [employer]. Patient has Medicare and is not eligible for a copay card, but may be able to apply for patient assistance or Medicare RX Payment Plan (Patient Must reach out to their plan, if eligible for payment plan), if available.    Ran test claim for Eliquis 5mg  tablet and the current 30 day co-pay is $0.   This test claim was processed through [employer]- copay amounts may vary at other pharmacies due to [employer], or as the patient moves through the different stages of their insurance plan.

## 2024-09-08 ENCOUNTER — Ambulatory Visit: Payer: Self-pay | Admitting: Cardiology

## 2024-09-08 LAB — CBC
Hematocrit: 48.9 % — ABNORMAL HIGH (ref 34.0–46.6)
Hemoglobin: 16 g/dL — ABNORMAL HIGH (ref 11.1–15.9)
MCH: 31.9 pg (ref 26.6–33.0)
MCHC: 32.7 g/dL (ref 31.5–35.7)
MCV: 98 fL — ABNORMAL HIGH (ref 79–97)
Platelets: 191 x10E3/uL (ref 150–450)
RBC: 5.01 x10E6/uL (ref 3.77–5.28)
RDW: 13.3 % (ref 11.7–15.4)
WBC: 9.3 x10E3/uL (ref 3.4–10.8)

## 2024-09-08 LAB — BASIC METABOLIC PANEL WITH GFR
BUN/Creatinine Ratio: 7 — ABNORMAL LOW (ref 12–28)
BUN: 6 mg/dL — ABNORMAL LOW (ref 8–27)
CO2: 24 mmol/L (ref 20–29)
Calcium: 9.4 mg/dL (ref 8.7–10.3)
Chloride: 103 mmol/L (ref 96–106)
Creatinine, Ser: 0.81 mg/dL (ref 0.57–1.00)
Glucose: 224 mg/dL — ABNORMAL HIGH (ref 70–99)
Potassium: 4.3 mmol/L (ref 3.5–5.2)
Sodium: 141 mmol/L (ref 134–144)
eGFR: 71 mL/min/1.73 (ref >59)

## 2024-09-08 LAB — CUP PACEART REMOTE DEVICE CHECK
Battery Remaining Longevity: 86 mo
Battery Remaining Percentage: 84 %
Battery Voltage: 2.99 V
Brady Statistic AP VP Percent: 32 %
Brady Statistic AP VS Percent: 1 %
Brady Statistic AS VP Percent: 67 %
Brady Statistic AS VS Percent: 1 %
Brady Statistic RA Percent Paced: 31 %
Brady Statistic RV Percent Paced: 99 %
Date Time Interrogation Session: 20251215023808
Implantable Lead Connection Status: 753985
Implantable Lead Connection Status: 753985
Implantable Lead Implant Date: 20240616
Implantable Lead Implant Date: 20240616
Implantable Lead Location: 753859
Implantable Lead Location: 753860
Implantable Pulse Generator Implant Date: 20240616
Lead Channel Impedance Value: 450 Ohm
Lead Channel Impedance Value: 450 Ohm
Lead Channel Pacing Threshold Amplitude: 0.75 V
Lead Channel Pacing Threshold Amplitude: 0.75 V
Lead Channel Pacing Threshold Pulse Width: 0.5 ms
Lead Channel Pacing Threshold Pulse Width: 0.5 ms
Lead Channel Sensing Intrinsic Amplitude: 3.5 mV
Lead Channel Sensing Intrinsic Amplitude: 9.6 mV
Lead Channel Setting Pacing Amplitude: 2 V
Lead Channel Setting Pacing Amplitude: 2.5 V
Lead Channel Setting Pacing Pulse Width: 0.5 ms
Lead Channel Setting Sensing Sensitivity: 4 mV
Pulse Gen Model: 2272
Pulse Gen Serial Number: 8185505

## 2024-09-08 NOTE — Telephone Encounter (Signed)
 I left a voicemail for Ms. Mervine's Daughter Lacey Kidney with an update from Suzann Riddle, NP.  Labs are overall stable compared to prior labs. Glucose was elevated. Please continue Eliquis  5 mg twice a day.

## 2024-09-11 ENCOUNTER — Ambulatory Visit: Payer: Self-pay | Admitting: Internal Medicine

## 2024-09-11 NOTE — Progress Notes (Signed)
 Remote PPM Transmission

## 2024-10-14 ENCOUNTER — Telehealth: Payer: Self-pay

## 2024-10-14 NOTE — Telephone Encounter (Signed)
 Request for additional information/ ICD codes for billing purposes faxed to [facility] at [phone]. Original and Fax Confirmation sent to be imaged for patient chart.

## 2024-10-18 NOTE — Progress Notes (Unsigned)
 "     Electrophysiology Clinic Note    Date:  10/18/2024  Patient ID:  Jasmine, Crawford 1941-02-17, MRN [id number] PCP:  Myrna Kay, MD  Cardiologist:  Vina Gull, MD  Electrophysiologist:  Danelle Birmingham, MD  Electrophysiology APP:  Joncarlo Friberg, NP    Discussed the use of AI scribe software for clinical note transcription with the patient, who gave verbal consent to proceed.   Patient Profile    Chief Complaint: PPM follow-up  History of Present Illness: Jasmine Crawford is a 84 y.o. female with PMH notable for CHB s/p PPM, non-obs CAD, HTN, HLD, PAD, T2DM, vasc dementia; seen today for Danelle Birmingham, MD for routine electrophysiology followup.   She last saw Dr. Birmingham 06/2023 for routine 69-month follow-up after pacemaker implant.  She was doing well at that time pacemaker interrogation. Device clinic was alerted to A-fib episode lasting 3 hours on pacemaker 05/2024. I saw her 08/2024 to discus this and eliquis  was started. She was profoundly hypertensive during appt, had not taken AM medications.   On follow-up today, *** AF burden, symptoms *** palpitations *** bleeding concerns   - who is MD?  - does not need full device check       Arrhythmia/Device History St. Jude dual chamber PPM, imp 02/2023; dx CHB    ROS:  Please see the history of present illness. All other systems are reviewed and otherwise negative.    Physical Exam    VS:  There were no vitals taken for this visit. BMI: There is no height or weight on file to calculate BMI.           Wt Readings from Last 3 Encounters:  09/07/24 176 lb 6.4 oz (80 kg)  07/16/23 154 lb (69.9 kg)  07/11/23 154 lb (69.9 kg)     GEN- The patient is well appearing, alert and oriented x 3 today.   Lungs- Clear to ausculation bilaterally, normal work of breathing.  Heart- Regular rate and rhythm, no murmurs, rubs or gallops Extremities- 1-2+ peripheral edema, warm, dry Skin-  device pocket well-healed, no  tethering   *** Brief check performed without iterative lead testing/measurements    Device interrogation done today and reviewed by myself:  Battery 8.2 years Lead thresholds, impedence, sensing stable  Dependent down to VVI 40 Several AF episodes, longest ~3 hours Turned ON autocapture of RV   Studies Reviewed   Previous EP, cardiology notes.    EKG is ordered. Personal review of EKG from today shows:         TTE, 03/08/2023  1. Left ventricular ejection fraction, by estimation, is 55%. The left ventricle has normal function. The left ventricle has no regional wall motion abnormalities. Left ventricular diastolic parameters are indeterminate.   2. Right ventricular systolic function is moderately reduced. The right ventricular size is moderately enlarged.   3. Left atrial size was moderately dilated.   4. Right atrial size was moderately dilated.   5. The mitral valve is abnormal. Mild mitral valve regurgitation. No evidence of mitral stenosis. Moderate mitral annular calcification.   6. The aortic valve is tricuspid. There is moderate calcification of the aortic valve. There is moderate thickening of the aortic valve. Aortic valve regurgitation is not visualized. Aortic valve sclerosis is present, with no evidence of aortic valve stenosis.   7. The inferior vena cava is normal in size with greater than 50% respiratory variability, suggesting right atrial pressure of 3 mmHg.   Coronary CT, 01/01/2022  1. Coronary calcium  score of 248. This was 63rd percentile for age-, sex, and race-matched controls.  2. Normal coronary origin with right dominance.  3. 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  4. Moderate LCX stenosis (50-69%).  5. Mild CAD (25-49%) in the LAD/RCA.   Assessment and Plan     #) CHB s/p PPM Device functioning well, see paceart for details Stable lead measurements Dependent Battery good AF episodes noted, see below   #) parox AFib Parox AF episodes noted  on device interrogation, patient asymptomatic of episodes Overall burden < 1% Ventricular rate well-controlled during episodes  #) Hypercoag d/t subclinical afib CHA2DS2-VASc Score = at least 6 [CHF History: 0, HTN History: 1, Diabetes History: 1, Stroke History: 0, Vascular Disease History: 1, Age Score: 2, Gender Score: 1].  Therefore, the patient's annual risk of stroke is 9.7 %.    Stroke ppx - will start 5mg  eliquis  BID Update BMP, CBC today Recommended patient and daughter to monitor for bleeding concerns Stop 81mg  asa, stop naproxen. Avoid NSAID usage, ok to use tylenol  PRN  #) HTN Quite elevated in office today, patient has not taken medications this morning Recommended to check BP more regularly at home and record measurements Continue 2.5mg  amlodipine , 25mg  hydrochlorothiazide , and 12.5mg  atenolol  Notify PCP if BP readings continue to be elevated       Current medicines are reviewed at length with the patient today.   The patient does not have concerns regarding her medicines.  The following changes were made today:   ***  Labs/ tests ordered today include:  No orders of the defined types were placed in this encounter.    Disposition: Follow up with EP Team or EP APP in 4 weeks   Signed, Yulissa Needham, NP  10/18/24  1:59 PM  Electrophysiology [facility] "

## 2024-10-19 ENCOUNTER — Ambulatory Visit: Admitting: Cardiology

## 2024-10-20 ENCOUNTER — Other Ambulatory Visit: Payer: Self-pay

## 2024-10-20 ENCOUNTER — Emergency Department (HOSPITAL_COMMUNITY)

## 2024-10-20 ENCOUNTER — Encounter (HOSPITAL_COMMUNITY): Payer: Self-pay

## 2024-10-20 ENCOUNTER — Emergency Department (HOSPITAL_COMMUNITY)
Admission: EM | Admit: 2024-10-20 | Discharge: 2024-10-20 | Disposition: A | Discharge status: Home / Self Care | Attending: Emergency Medicine | Admitting: Emergency Medicine

## 2024-10-20 ENCOUNTER — Ambulatory Visit: Admitting: Cardiology

## 2024-10-20 DIAGNOSIS — Z950 Presence of cardiac pacemaker: Secondary | ICD-10-CM | POA: Diagnosis not present

## 2024-10-20 DIAGNOSIS — W1839XA Other fall on same level, initial encounter: Secondary | ICD-10-CM | POA: Insufficient documentation

## 2024-10-20 DIAGNOSIS — F0390 Unspecified dementia without behavioral disturbance: Secondary | ICD-10-CM | POA: Diagnosis not present

## 2024-10-20 DIAGNOSIS — E119 Type 2 diabetes mellitus without complications: Secondary | ICD-10-CM | POA: Insufficient documentation

## 2024-10-20 DIAGNOSIS — I4891 Unspecified atrial fibrillation: Secondary | ICD-10-CM | POA: Insufficient documentation

## 2024-10-20 DIAGNOSIS — M542 Cervicalgia: Secondary | ICD-10-CM | POA: Insufficient documentation

## 2024-10-20 DIAGNOSIS — Z7984 Long term (current) use of oral hypoglycemic drugs: Secondary | ICD-10-CM | POA: Diagnosis not present

## 2024-10-20 DIAGNOSIS — W19XXXA Unspecified fall, initial encounter: Secondary | ICD-10-CM

## 2024-10-20 DIAGNOSIS — E039 Hypothyroidism, unspecified: Secondary | ICD-10-CM | POA: Insufficient documentation

## 2024-10-20 DIAGNOSIS — Z7901 Long term (current) use of anticoagulants: Secondary | ICD-10-CM | POA: Insufficient documentation

## 2024-10-20 DIAGNOSIS — I10 Essential (primary) hypertension: Secondary | ICD-10-CM | POA: Insufficient documentation

## 2024-10-20 DIAGNOSIS — I2510 Atherosclerotic heart disease of native coronary artery without angina pectoris: Secondary | ICD-10-CM | POA: Insufficient documentation

## 2024-10-20 DIAGNOSIS — R519 Headache, unspecified: Secondary | ICD-10-CM | POA: Insufficient documentation

## 2024-10-20 LAB — URINALYSIS, W/ REFLEX TO CULTURE (INFECTION SUSPECTED)
Bilirubin Urine: NEGATIVE
Glucose, UA: >=500 mg/dL — AB
Ketones, ur: 5 mg/dL — AB
Nitrite: NEGATIVE
Protein, ur: 100 mg/dL — AB
Specific Gravity, Urine: 1.028 (ref 1.005–1.030)
pH: 5 (ref 5.0–8.0)

## 2024-10-20 LAB — TROPONIN T, HIGH SENSITIVITY
Troponin T High Sensitivity: 26 ng/L — ABNORMAL HIGH (ref 0–19)
Troponin T High Sensitivity: 19 ng/L (ref 0–19)

## 2024-10-20 LAB — CBC
HCT: 46.1 % — ABNORMAL HIGH (ref 36.0–46.0)
Hemoglobin: 15.8 g/dL — ABNORMAL HIGH (ref 12.0–15.0)
MCH: 32.2 pg (ref 26.0–34.0)
MCHC: 34.3 g/dL (ref 30.0–36.0)
MCV: 94.1 fL (ref 80.0–100.0)
Platelets: 218 10*3/uL (ref 150–400)
RBC: 4.9 MIL/uL (ref 3.87–5.11)
RDW: 13.3 % (ref 11.5–15.5)
WBC: 13 10*3/uL — ABNORMAL HIGH (ref 4.0–10.5)
nRBC: 0 % (ref 0.0–0.2)

## 2024-10-20 LAB — BASIC METABOLIC PANEL WITH GFR
Anion gap: 13 (ref 5–15)
BUN: 23 mg/dL (ref 8–23)
CO2: 24 mmol/L (ref 22–32)
Calcium: 9.7 mg/dL (ref 8.9–10.3)
Chloride: 99 mmol/L (ref 98–111)
Creatinine, Ser: 0.94 mg/dL (ref 0.44–1.00)
GFR, Estimated: 59 mL/min — ABNORMAL LOW
Glucose, Bld: 391 mg/dL — ABNORMAL HIGH (ref 70–99)
Potassium: 4.1 mmol/L (ref 3.5–5.1)
Sodium: 136 mmol/L (ref 135–145)

## 2024-10-20 LAB — CBG MONITORING, ED: Glucose-Capillary: 333 mg/dL — ABNORMAL HIGH (ref 70–99)

## 2024-10-20 MED ORDER — INSULIN ASPART 100 UNIT/ML IJ SOLN: 6.0000 [IU] | Freq: Once | INTRAMUSCULAR | Status: DC

## 2024-10-20 MED ORDER — INSULIN ASPART 100 UNIT/ML IJ SOLN
5.0000 [IU] | Freq: Once | INTRAMUSCULAR | Status: AC
  Administered 2024-10-20: 5 [IU] via SUBCUTANEOUS
  Filled 2024-10-20: qty 5

## 2024-10-20 MED ORDER — SODIUM CHLORIDE 0.9 % IV SOLN
1.0000 g | Freq: Once | INTRAVENOUS | Status: AC
  Administered 2024-10-20: 1 g via INTRAVENOUS
  Filled 2024-10-20: qty 10

## 2024-10-20 MED ORDER — OXYCODONE-ACETAMINOPHEN 5-325 MG PO TABS
1.0000 | ORAL_TABLET | Freq: Once | ORAL | Status: AC
  Administered 2024-10-20: 1 via ORAL
  Filled 2024-10-20: qty 1

## 2024-10-20 MED ORDER — LACTATED RINGERS IV BOLUS: 500.0000 mL | Freq: Once | INTRAVENOUS | Status: DC

## 2024-10-20 MED ORDER — CEPHALEXIN 500 MG PO CAPS: 500.0000 mg | ORAL_CAPSULE | Freq: Four times a day (QID) | ORAL | 0 refills | Status: AC

## 2024-10-20 MED ORDER — ALUM & MAG HYDROXIDE-SIMETH 200-200-20 MG/5ML PO SUSP
30.0000 mL | Freq: Once | ORAL | Status: AC
  Administered 2024-10-20: 30 mL via ORAL
  Filled 2024-10-20: qty 30

## 2024-10-20 MED ORDER — LIDOCAINE VISCOUS HCL 2 % MT SOLN
15.0000 mL | Freq: Once | OROMUCOSAL | Status: AC
  Administered 2024-10-20: 15 mL via ORAL
  Filled 2024-10-20: qty 15

## 2024-10-20 MED ORDER — LACTATED RINGERS IV BOLUS
1000.0000 mL | Freq: Once | INTRAVENOUS | Status: AC
  Administered 2024-10-20: 1000 mL via INTRAVENOUS

## 2024-10-20 NOTE — ED Provider Notes (Signed)
 Care of patient assumed from Dr. Patsey.  This patient presents after an unwitnessed fall at home.  She does have dementia but lives with her daughter.  She was found today, seated on the floor leaning against a cabinet.  She was endorsing pain in head and neck.  She is awaiting imaging studies and pacemaker interrogation. Physical Exam  BP (!) 145/80   Pulse 61   Temp 97.7 F (36.5 C) (Oral)   Resp 15   Ht 5' 5 (1.651 m)   Wt 77.1 kg   SpO2 100%   BMI 28.29 kg/m   Physical Exam Vitals and nursing note reviewed.  Constitutional:      General: She is not in acute distress.    Appearance: Normal appearance. She is well-developed. She is not ill-appearing, toxic-appearing or diaphoretic.  HENT:     Head: Normocephalic and atraumatic.     Right Ear: External ear normal.     Left Ear: External ear normal.     Nose: Nose normal.     Mouth/Throat:     Mouth: Mucous membranes are moist.  Eyes:     Extraocular Movements: Extraocular movements intact.     Conjunctiva/sclera: Conjunctivae normal.  Cardiovascular:     Rate and Rhythm: Normal rate and regular rhythm.  Pulmonary:     Effort: Pulmonary effort is normal. No respiratory distress.  Abdominal:     General: There is no distension.     Palpations: Abdomen is soft.     Tenderness: There is no abdominal tenderness.  Musculoskeletal:        General: No swelling. Normal range of motion.     Cervical back: Normal range of motion and neck supple.  Skin:    General: Skin is warm and dry.     Coloration: Skin is not jaundiced or pale.  Neurological:     General: No focal deficit present.     Mental Status: She is alert. Mental status is at baseline.  Psychiatric:        Mood and Affect: Mood normal.        Behavior: Behavior normal.     Procedures  Procedures  ED Course / MDM    Medical Decision Making Amount and/or Complexity of Data Reviewed Labs: ordered. Radiology: ordered.  Risk OTC drugs. Prescription drug  management.   Patient underwent pacemaker interrogation by pacemaker representative.  He reports that she has a normally functioning pacemaker with no recent abnormal rhythms.  On exam, patient resting comfortably.  Her daughter is present at bedside.  Daughter confirms that she did not have prolonged downtime.  It is unclear of why she fell but she did have some Nilla wafers on the floor next to her that her daughter thinks she may have been trying to get.  Daughter has noted some recent generalized weakness and urinary frequency.  There is concern of possible UTI.  She does endorse some mild suprapubic discomfort.  Patient typically wears a pull-up.  Will obtain urine by In-N-Out catheter.  Patient's frequency may be secondary to hyperglycemia with glucosuria.  Her fatigue may be secondary to dehydration.  IV fluids were ordered.  Patient also endorsing symptoms of reflux.  GI cocktail was ordered.  On reassessment, patient endorsing pain in her right knee.  Daughter confirms that this is chronic.  She did recently undergo a cortisone injection.  Knee is not erythematous, warm, or swollen.  Dose Percocet was ordered for her chronic pain.  Her urinalysis does show  evidence of infection.  Dose of ceftriaxone  was ordered.  After IV fluids, CBG remained in the 300s.  Correction dose of insulin  was ordered.  Patient was discharged in stable condition.       Melvenia Motto, MD 10/20/24 [phone]

## 2024-10-20 NOTE — Progress Notes (Unsigned)
 "     Electrophysiology Clinic Note    Date:  10/20/2024  Patient ID:  Jasmine Crawford, Jasmine Crawford [birth year], MRN [id number] PCP:  Myrna Kay, MD  Cardiologist:  Vina Gull, MD  Electrophysiologist:  Danelle Birmingham, MD  Electrophysiology APP:  Januel Doolan, NP    Discussed the use of AI scribe software for clinical note transcription with the patient, who gave verbal consent to proceed.   Patient Profile    Chief Complaint: PPM follow-up  History of Present Illness: Jasmine Crawford is a 84 y.o. female with PMH notable for CHB s/p PPM, non-obs CAD, HTN, HLD, PAD, T2DM, vasc dementia; seen today for Danelle Birmingham, MD for routine electrophysiology followup.   She last saw Dr. Birmingham 06/2023 for routine 77-month follow-up after pacemaker implant.  She was doing well at that time pacemaker interrogation. Device clinic was alerted to A-fib episode lasting 3 hours on pacemaker 05/2024. I saw her 08/2024 to discus this and eliquis  was started. She was profoundly hypertensive during appt, had not taken AM medications.   On follow-up today, *** AF burden, symptoms *** palpitations *** bleeding concerns   - who is MD?  - does not need full device check       Arrhythmia/Device History St. Jude dual chamber PPM, imp 02/2023; dx CHB    ROS:  Please see the history of present illness. All other systems are reviewed and otherwise negative.    Physical Exam    VS:  There were no vitals taken for this visit. BMI: There is no height or weight on file to calculate BMI.           Wt Readings from Last 3 Encounters:  10/20/24 170 lb (77.1 kg)  09/07/24 176 lb 6.4 oz (80 kg)  07/16/23 154 lb (69.9 kg)     GEN- The patient is well appearing, alert and oriented x 3 today.   Lungs- Clear to ausculation bilaterally, normal work of breathing.  Heart- Regular rate and rhythm, no murmurs, rubs or gallops Extremities- 1-2+ peripheral edema, warm, dry Skin-  device pocket well-healed, no  tethering   *** Brief check performed without iterative lead testing/measurements    Device interrogation done today and reviewed by myself:  Battery 8.2 years Lead thresholds, impedence, sensing stable  Dependent down to VVI 40 Several AF episodes, longest ~3 hours Turned ON autocapture of RV   Studies Reviewed   Previous EP, cardiology notes.    EKG is ordered. Personal review of EKG from today shows:         TTE, 03/08/2023  1. Left ventricular ejection fraction, by estimation, is 55%. The left ventricle has normal function. The left ventricle has no regional wall motion abnormalities. Left ventricular diastolic parameters are indeterminate.   2. Right ventricular systolic function is moderately reduced. The right ventricular size is moderately enlarged.   3. Left atrial size was moderately dilated.   4. Right atrial size was moderately dilated.   5. The mitral valve is abnormal. Mild mitral valve regurgitation. No evidence of mitral stenosis. Moderate mitral annular calcification.   6. The aortic valve is tricuspid. There is moderate calcification of the aortic valve. There is moderate thickening of the aortic valve. Aortic valve regurgitation is not visualized. Aortic valve sclerosis is present, with no evidence of aortic valve stenosis.   7. The inferior vena cava is normal in size with greater than 50% respiratory variability, suggesting right atrial pressure of 3 mmHg.   Coronary CT, 01/01/2022  1. Coronary calcium  score of 248. This was 63rd percentile for age-, sex, and race-matched controls.  2. Normal coronary origin with right dominance.  3. 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  4. Moderate LCX stenosis (50-69%).  5. Mild CAD (25-49%) in the LAD/RCA.   Assessment and Plan     #) CHB s/p PPM Device functioning well, see paceart for details Stable lead measurements Dependent Battery good AF episodes noted, see below  #) parox AFib Parox AF episodes noted  on device interrogation, patient asymptomatic of episodes Overall burden < 1% Ventricular rate well-controlled during episodes  #) Hypercoag d/t subclinical afib CHA2DS2-VASc Score = at least 6 [CHF History: 0, HTN History: 1, Diabetes History: 1, Stroke History: 0, Vascular Disease History: 1, Age Score: 2, Gender Score: 1].  Therefore, the patient's annual risk of stroke is 9.7 %.    Stroke ppx - will start 5mg  eliquis  BID Update BMP, CBC today Recommended patient and daughter to monitor for bleeding concerns Stop 81mg  asa, stop naproxen. Avoid NSAID usage, ok to use tylenol  PRN  #) HTN Quite elevated in office today, patient has not taken medications this morning Recommended to check BP more regularly at home and record measurements Continue 2.5mg  amlodipine , 25mg  hydrochlorothiazide , and 12.5mg  atenolol  Notify PCP if BP readings continue to be elevated       Current medicines are reviewed at length with the patient today.   The patient does not have concerns regarding her medicines.  The following changes were made today:   ***  Labs/ tests ordered today include:  No orders of the defined types were placed in this encounter.    Disposition: Follow up with EP Team or EP APP in 4 weeks   Signed, Unique Searfoss, NP  10/20/24  2:55 PM  Electrophysiology [facility] "

## 2024-10-20 NOTE — Discharge Instructions (Addendum)
 You were given your first dose of antibiotics for treatment of a UTI in the emergency department.  A prescription for ongoing antibiotics was sent to your pharmacy.  Take as prescribed, starting tomorrow.  Drink plenty of water at home to maintain good hydration.  You should follow-up with your primary care doctor for ongoing management of your diabetes.  You may need increased medications to maintain good sugar control.  Maintaining sugar control can be helpful to avoid dehydration and minimize risk of UTIs.

## 2024-10-20 NOTE — ED Provider Notes (Signed)
 " [place] [facility] AT [facility] Provider Note   CSN: [id number] Arrival date & time: 10/20/24  1432     Patient presents with: Jasmine Crawford is a 84 y.o. female.    Fall  Patient presents after fall.  Reportedly found sitting on the ground leaning up against a cabinet.  Complaining of some pain in her head and neck.  Does have baseline dementia.  Is on anticoagulation.  History of A-fib.  Also Primus reportedly has had urinary frequency.    Past Medical History:  Diagnosis Date   Arthritis    Blind left eye    CAD (coronary artery disease)    Complete heart block (HCC)    Diabetes (HCC)    Hyperlipidemia    Hypertension    Hypothyroid    Pacemaker    PAD (peripheral artery disease)     Prior to Admission medications  Medication Sig Start Date End Date Taking? Authorizing Provider  albuterol (VENTOLIN HFA) 108 (90 Base) MCG/ACT inhaler Inhale 1-2 puffs into the lungs every 6 (six) hours as needed for wheezing or shortness of breath.    [provider]  amLODipine  (NORVASC ) 2.5 MG tablet Take 1 tablet (2.5 mg total) by mouth daily. 01/29/23 09/07/24  Okey Vina GAILS, MD  ammonium lactate (LAC-HYDRIN) 12 % lotion Apply 1 application. topically as needed for dry skin.    [provider]  apixaban  (ELIQUIS ) 5 MG TABS tablet Take 1 tablet (5 mg total) by mouth 2 (two) times daily. 09/07/24   Riddle, Suzann, NP  atenolol  (TENORMIN ) 25 MG tablet Take 0.5 tablets (12.5 mg total) by mouth daily. 01/29/23   Okey Vina GAILS, MD  FLUoxetine (PROZAC) 20 MG capsule Take 20 mg by mouth in the morning. Patient not taking: Reported on 09/07/2024    [provider]  fluticasone (FLONASE) 50 MCG/ACT nasal spray Place 1 spray into both nostrils 2 (two) times daily as needed for allergies or rhinitis.    [provider]  hydrochlorothiazide  (HYDRODIURIL ) 25 MG tablet Take 25 mg by mouth at bedtime.    [provider]   latanoprost (XALATAN) 0.005 % ophthalmic solution Place 1 drop into both eyes at bedtime.    [provider]  memantine (NAMENDA) 10 MG tablet Take 10 mg by mouth in the morning. Patient not taking: Reported on 09/07/2024    [provider]  metFORMIN (GLUCOPHAGE-XR) 500 MG 24 hr tablet Take 500 mg by mouth daily with breakfast.    [provider]  nitroGLYCERIN  (NITROSTAT ) 0.4 MG SL tablet Place 1 tablet (0.4 mg total) under the tongue every 5 (five) minutes as needed for chest pain. 01/24/22 09/07/24  Parthenia Olivia HERO, PA-C  potassium chloride  SA (KLOR-CON  M) 20 MEQ tablet Take 20 mEq by mouth in the morning.    [provider]  rosuvastatin  (CRESTOR ) 20 MG tablet Take 1 tablet (20 mg total) by mouth daily. 01/03/22 09/07/24  Arrien, Elidia Sieving, MD  TYLENOL  8 HOUR ARTHRITIS PAIN 650 MG CR tablet Take 1,300 mg by mouth every 8 (eight) hours as needed for pain.    [provider]    Allergies: Bacitracin, Erythromycin base, Losartan, and Ramipril    Review of Systems  Updated Vital Signs BP (!) 160/88   Ht 5' 5 (1.651 m)   Wt 77.1 kg   BMI 28.29 kg/m   Physical Exam Vitals and nursing note reviewed.  HENT:     Head: Normocephalic.  Eyes:     Pupils: Pupils are equal, round, and reactive to light.  Neck:     Comments: Mild tenderness over left trapezius Cardiovascular:     Rate and Rhythm: Regular rhythm.  Abdominal:     Tenderness: There is no abdominal tenderness.  Musculoskeletal:        General: No tenderness.     Cervical back: Neck supple. No tenderness.  Skin:    General: Skin is warm.     Capillary Refill: Capillary refill takes less than 2 seconds.  Neurological:     Mental Status: She is alert. Mental status is at baseline.     (all labs ordered are listed, but only abnormal results are displayed) Labs Reviewed  CBC  BASIC METABOLIC PANEL WITH GFR  TROPONIN T, HIGH SENSITIVITY    EKG: None  Radiology: No  results found.   Procedures   Medications Ordered in the ED - No data to display                                  Medical Decision Making Amount and/or Complexity of Data Reviewed Labs: ordered. Radiology: ordered.   Patient with potential fall.  With mild neck pain.  Will get head CT and cervical spine CT due to age and complaints.  Unsure exact what happened.  Does have a history of complete heart block with pacemaker.  Will get [facility] pacemaker interrogated.  Will get chest x-ray and pelvic x-ray.  Will monitor check basic blood work.  With reported urinary frequency will also check urinalysis.  Care turned over to oncoming provider, Dr. Melvenia.    Final diagnoses:  None    ED Discharge Orders     None          Patsey Lot, MD 10/20/24 1443  "

## 2024-10-20 NOTE — ED Triage Notes (Signed)
 Pt bib ems from home when daughter found pt leaning up against a cabinet. Daughter does not know how long she was down but at most 1 hour. Pt with dementia. Complains of hitting head. CBG 433, with regent urination per daughter. Arrives in [place]collar.   126/74 HR 70 paced 97% RA

## 2024-10-20 NOTE — Progress Notes (Signed)
 Orthopedic Tech Progress Note Patient Details:  Jasmine Crawford 1941-06-05 [id number] Level 2 Trauma. Not needed Patient ID: Jasmine Crawford, female   DOB: 08/15/1941, 84 y.o.   MRN: [id number]  Efrain Jasmine Crawford Cos 10/20/2024, 2:45 PM

## 2024-10-21 ENCOUNTER — Ambulatory Visit: Admitting: Cardiology

## 2024-10-21 ENCOUNTER — Telehealth: Payer: Self-pay

## 2024-10-21 NOTE — Telephone Encounter (Signed)
 I spoke with  Ms. Picotte's Daughter Lacey in regards to patient's scheduled appointment in Electrophysiology. As per Suzann Riddle, NP, today's appointment was a follow up on the new medication eliquis  and to draw labs. Ms. Brogan was in the ED this week for a fall and they drew the labs we would have needed so it would be ok if the family wanted to reschedule today's appointment. Patient's Daughter agreed and will call to reschedule when her Mother was stronger and or felt they needed to be seen. All questions answered at this time. Suzann Riddle, NP notified.

## 2024-10-23 LAB — URINE CULTURE: Culture: NO GROWTH
